# Patient Record
Sex: Female | Born: 1965 | Race: Asian | Hispanic: No | Marital: Married | Smoking: Never smoker
Health system: Southern US, Community
[De-identification: ages and names within clinical notes are randomized; demographics above are authoritative.]

## PROBLEM LIST (undated history)

## (undated) DIAGNOSIS — I10 Essential (primary) hypertension: Secondary | ICD-10-CM

## (undated) DIAGNOSIS — R87810 Cervical high risk human papillomavirus (HPV) DNA test positive: Principal | ICD-10-CM

## (undated) DIAGNOSIS — E669 Obesity, unspecified: Secondary | ICD-10-CM

## (undated) DIAGNOSIS — R87610 Atypical squamous cells of undetermined significance on cytologic smear of cervix (ASC-US): Secondary | ICD-10-CM

## (undated) DIAGNOSIS — E785 Hyperlipidemia, unspecified: Secondary | ICD-10-CM

## (undated) DIAGNOSIS — E079 Disorder of thyroid, unspecified: Secondary | ICD-10-CM

## (undated) DIAGNOSIS — Z8719 Personal history of other diseases of the digestive system: Secondary | ICD-10-CM

## (undated) HISTORY — DX: Hyperlipidemia, unspecified: E78.5

## (undated) HISTORY — DX: Obesity, unspecified: E66.9

## (undated) HISTORY — DX: Essential (primary) hypertension: I10

## (undated) HISTORY — DX: Atypical squamous cells of undetermined significance on cytologic smear of cervix (ASC-US): R87.610

## (undated) HISTORY — DX: Cervical high risk human papillomavirus (HPV) DNA test positive: R87.810

## (undated) HISTORY — DX: Personal history of other diseases of the digestive system: Z87.19

---

## 2005-10-08 ENCOUNTER — Ambulatory Visit: Payer: Self-pay | Admitting: Internal Medicine

## 2005-10-23 ENCOUNTER — Ambulatory Visit: Payer: Self-pay | Admitting: Internal Medicine

## 2005-11-28 ENCOUNTER — Ambulatory Visit: Payer: Self-pay | Admitting: Internal Medicine

## 2006-04-14 ENCOUNTER — Ambulatory Visit: Payer: Self-pay | Admitting: Internal Medicine

## 2006-04-14 LAB — CONVERTED CEMR LAB
BUN: 12 mg/dL (ref 6–23)
CO2: 29 meq/L (ref 19–32)
Cholesterol: 220 mg/dL (ref 0–200)
Creatinine, Ser: 0.5 mg/dL (ref 0.4–1.2)
GFR calc Af Amer: 174 mL/min
Glucose, Bld: 122 mg/dL — ABNORMAL HIGH (ref 70–99)
HDL: 38.4 mg/dL — ABNORMAL LOW (ref >39.0)
Potassium: 3.7 meq/L (ref 3.5–5.1)
Sodium: 138 meq/L (ref 135–145)
Triglycerides: 190 mg/dL — ABNORMAL HIGH (ref 0–149)

## 2006-04-16 ENCOUNTER — Ambulatory Visit: Payer: Self-pay | Admitting: Internal Medicine

## 2007-05-21 ENCOUNTER — Encounter: Payer: Self-pay | Admitting: Internal Medicine

## 2007-07-06 LAB — HM MAMMOGRAPHY: HM Mammogram: NORMAL

## 2007-07-10 DIAGNOSIS — I10 Essential (primary) hypertension: Secondary | ICD-10-CM

## 2007-07-10 DIAGNOSIS — E785 Hyperlipidemia, unspecified: Secondary | ICD-10-CM | POA: Insufficient documentation

## 2007-07-13 ENCOUNTER — Telehealth: Payer: Self-pay | Admitting: Internal Medicine

## 2007-07-13 ENCOUNTER — Ambulatory Visit: Payer: Self-pay | Admitting: Internal Medicine

## 2007-07-13 DIAGNOSIS — Z8719 Personal history of other diseases of the digestive system: Secondary | ICD-10-CM

## 2007-07-13 DIAGNOSIS — R05 Cough: Secondary | ICD-10-CM | POA: Insufficient documentation

## 2007-07-13 DIAGNOSIS — R059 Cough, unspecified: Secondary | ICD-10-CM | POA: Insufficient documentation

## 2007-09-16 ENCOUNTER — Ambulatory Visit: Payer: Self-pay | Admitting: Internal Medicine

## 2007-09-16 LAB — CONVERTED CEMR LAB
Calcium: 9.1 mg/dL (ref 8.4–10.5)
Cholesterol: 208 mg/dL (ref 0–200)
Creatinine, Ser: 0.5 mg/dL (ref 0.4–1.2)
Direct LDL: 138.8 mg/dL
GFR calc Af Amer: 173 mL/min
HDL: 35.4 mg/dL — ABNORMAL LOW (ref >39.0)
Hgb A1c MFr Bld: 6.6 % — ABNORMAL HIGH (ref 4.6–6.0)
Sodium: 139 meq/L (ref 135–145)
Total CHOL/HDL Ratio: 5.9
Triglycerides: 186 mg/dL — ABNORMAL HIGH (ref 0–149)
VLDL: 37 mg/dL (ref 0–40)

## 2007-09-20 ENCOUNTER — Telehealth: Payer: Self-pay | Admitting: Internal Medicine

## 2007-09-30 ENCOUNTER — Ambulatory Visit: Payer: Self-pay | Admitting: Internal Medicine

## 2007-09-30 ENCOUNTER — Telehealth: Payer: Self-pay | Admitting: Internal Medicine

## 2007-09-30 DIAGNOSIS — R5381 Other malaise: Secondary | ICD-10-CM

## 2007-09-30 DIAGNOSIS — R5383 Other fatigue: Secondary | ICD-10-CM

## 2007-09-30 DIAGNOSIS — IMO0001 Reserved for inherently not codable concepts without codable children: Secondary | ICD-10-CM

## 2007-09-30 DIAGNOSIS — D179 Benign lipomatous neoplasm, unspecified: Secondary | ICD-10-CM | POA: Insufficient documentation

## 2007-09-30 LAB — CONVERTED CEMR LAB: Total CK: 61 units/L (ref 7–177)

## 2007-11-03 ENCOUNTER — Encounter: Payer: Self-pay | Admitting: Internal Medicine

## 2007-12-31 ENCOUNTER — Ambulatory Visit: Payer: Self-pay | Admitting: Internal Medicine

## 2007-12-31 DIAGNOSIS — R1013 Epigastric pain: Secondary | ICD-10-CM

## 2007-12-31 DIAGNOSIS — K3189 Other diseases of stomach and duodenum: Secondary | ICD-10-CM

## 2007-12-31 LAB — CONVERTED CEMR LAB
Helicobacter Pylori Antibody-IgG: 0.4
Thyroglobulin Ab: 100.3 — ABNORMAL HIGH (ref 0.0–60.0)
Thyroperoxidase Ab SerPl-aCnc: 26.4 (ref 0.0–60.0)

## 2008-01-04 ENCOUNTER — Encounter: Payer: Self-pay | Admitting: Internal Medicine

## 2009-01-25 ENCOUNTER — Ambulatory Visit: Payer: Self-pay | Admitting: Internal Medicine

## 2009-01-25 LAB — CONVERTED CEMR LAB
ALT: 59 units/L — ABNORMAL HIGH (ref 0–35)
Albumin: 4.7 g/dL (ref 3.5–5.2)
Alkaline Phosphatase: 53 units/L (ref 39–117)
BUN: 8 mg/dL (ref 6–23)
Chloride: 102 meq/L (ref 96–112)
Cholesterol: 255 mg/dL — ABNORMAL HIGH (ref 0–200)
Creatinine, Ser: 0.54 mg/dL (ref 0.40–1.20)
HDL: 38 mg/dL — ABNORMAL LOW (ref >39)
Indirect Bilirubin: 0.5 mg/dL (ref 0.0–0.9)
LDL Cholesterol: 164 mg/dL — ABNORMAL HIGH (ref 0–99)
Potassium: 4.2 meq/L (ref 3.5–5.3)
Total Protein: 7.8 g/dL (ref 6.0–8.3)
Triglycerides: 266 mg/dL — ABNORMAL HIGH (ref <150)
VLDL: 53 mg/dL — ABNORMAL HIGH (ref 0–40)

## 2009-01-26 ENCOUNTER — Encounter: Payer: Self-pay | Admitting: Internal Medicine

## 2009-08-18 ENCOUNTER — Telehealth: Payer: Self-pay | Admitting: Internal Medicine

## 2009-09-15 LAB — HM COLONOSCOPY: HM Colonoscopy: NORMAL

## 2010-04-17 NOTE — Progress Notes (Signed)
  Phone Note Other Incoming   Summary of Call: pt going on 2 month trip to [place]. [place].  She requests - 3 month supply  Initial call taken by: D. Thomos Lemons DO,  August 18, 2009 9:32 AM    Prescriptions: PRAVASTATIN SODIUM 40 MG TABS (PRAVASTATIN SODIUM) one by mouth qpm  #90 x 1   Entered and Authorized by:   D. Thomos Lemons DO   Signed by:   D. Thomos Lemons DO on 08/18/2009   Method used:   [facility]        [id number]       [address] [place], [place]  [address]       Ph: [phone] or [phone]       Fax: [phone]   RxID:   [id number] LISINOPRIL 10 MG TABS (LISINOPRIL) one by mouth once daily  #90 x 1   Entered and Authorized by:   D. Thomos Lemons DO   Signed by:   D. Thomos Lemons DO on 08/18/2009   Method used:   [facility]        [id number]       [address]       [place], [place]  [address]       Ph: [phone] or [phone]       Fax: [phone]   RxID:   [id number]

## 2010-07-15 ENCOUNTER — Encounter: Payer: Self-pay | Admitting: Internal Medicine

## 2010-07-17 ENCOUNTER — Encounter: Payer: Self-pay | Admitting: Internal Medicine

## 2010-07-17 ENCOUNTER — Ambulatory Visit (INDEPENDENT_AMBULATORY_CARE_PROVIDER_SITE_OTHER): Payer: 59 | Admitting: Internal Medicine

## 2010-07-17 DIAGNOSIS — E039 Hypothyroidism, unspecified: Secondary | ICD-10-CM

## 2010-07-17 DIAGNOSIS — R7309 Other abnormal glucose: Secondary | ICD-10-CM

## 2010-07-17 DIAGNOSIS — I10 Essential (primary) hypertension: Secondary | ICD-10-CM

## 2010-07-17 DIAGNOSIS — E041 Nontoxic single thyroid nodule: Secondary | ICD-10-CM | POA: Insufficient documentation

## 2010-07-17 DIAGNOSIS — E785 Hyperlipidemia, unspecified: Secondary | ICD-10-CM

## 2010-07-17 MED ORDER — LOSARTAN POTASSIUM 25 MG PO TABS: 25.0000 mg | ORAL_TABLET | Freq: Every day | ORAL | Status: DC

## 2010-07-17 MED ORDER — LEVOTHYROXINE SODIUM 50 MCG PO TABS: ORAL_TABLET | ORAL | Status: DC

## 2010-07-17 MED ORDER — ATORVASTATIN CALCIUM 20 MG PO TABS: 20.0000 mg | ORAL_TABLET | Freq: Every day | ORAL | Status: DC

## 2010-07-17 NOTE — Assessment & Plan Note (Signed)
Poorly controlled on pravastatin. Prev LDL 189 10/11/2009 Change to lipitor Dyslipidemia also likely aggravated by hypothyroidims

## 2010-07-17 NOTE — Progress Notes (Signed)
  Subjective:    Patient ID: Alexandria Ayers, female    DOB: [birth year], 45 y.o.   MRN: [id number]  Hypertension This is a chronic problem. The current episode started more than 1 year ago. The problem is unchanged. The problem is controlled. There are no associated agents to hypertension. Past treatments include ACE inhibitors. There are no compliance problems.  There is no history of kidney disease, heart failure or left ventricular hypertrophy.   She c/o chronic dry cough and scratchy throat.  Int hx:  Seen by physicians in [place] for routine health screening.  Labs and thyroid u/s performed. She reports thyroid US showed nodule.  F/u u/s recommended in 6 months  Hyperlipidemia - labs reviewed.  Does not follow low sat fat diet  Review of Systems     Past Medical History  Diagnosis Date  . Abnormal glucose   . Hyperlipidemia   . Hypertension   . History of hemorrhoids   . Lipoma     History   Social History  . Marital Status: Married    Spouse Name: N/A    Number of Children: N/A  . Years of Education: N/A   Occupational History  . Not on file.   Social History Main Topics  . Smoking status: Never Smoker   . Smokeless tobacco: Not on file  . Alcohol Use: Not on file  . Drug Use: Not on file  . Sexually Active: Not on file   Other Topics Concern  . Not on file   Social History Narrative   Occupation: Housewife   Married   Never Smoked   Alcohol use-no   Daughter - Junious Dresser (8y/o)    No past surgical history on file.  Family History  Problem Relation Age of Onset  . Hypertension Mother   . Kidney disease Mother     chronic renal insufficiency or some manner of kidney abnormality  . Coronary artery disease      sibling  . Hypothyroidism Mother     s/p thryroid surgery and is hypothyroid  . Other      denies FH of colon cancer,and diabetes    No Known Allergies  Current Outpatient Prescriptions on File Prior to Visit  Medication Sig Dispense Refill  .  DISCONTD: lisinopril (PRINIVIL,ZESTRIL) 10 MG tablet Take 10 mg by mouth daily.        Marland Kitchen DISCONTD: pravastatin (PRAVACHOL) 40 MG tablet Take 40 mg by mouth every evening.        Marland Kitchen glucose blood (FREESTYLE LITE) test strip Use to check blood sugar once a day         BP 90/60  Pulse 72  Temp(Src) 98.9 F (37.2 C) (Oral)  Resp 16  Ht 5\' 3"  (1.6 m)  Wt 170 lb (77.111 kg)  BMI 30.11 kg/m2  SpO2 99%  LMP 06/19/2010    Objective:   Physical Exam  Constitutional: She appears well-developed and well-nourished.  Neck: Normal range of motion. Neck supple. No thyromegaly present.  Cardiovascular: Regular rhythm and normal heart sounds.   Pulmonary/Chest: Effort normal and breath sounds normal. She has no wheezes. She has no rales.  Lymphadenopathy:    She has no cervical adenopathy.  Neurological: She is alert.  Skin: Skin is warm and dry.  Psychiatric: She has a normal mood and affect. Her behavior is normal.          Assessment & Plan:

## 2010-07-17 NOTE — Assessment & Plan Note (Signed)
Pt notes chronic dry cough.  It may be related to ACE.  I suggest change to losartan. We discussed risk of ACE or ARB during pregnancy.

## 2010-07-17 NOTE — Assessment & Plan Note (Signed)
Unchanged A1c 09/2009 - 6.2

## 2010-07-17 NOTE — Assessment & Plan Note (Signed)
Pt reports thyroid u/s completed while visiting family in [place] 2011 It noted thyroid nodule.  She does not recall size of nodule but she was advised to have f/u nodule in 6 months. Order for u/s provided.

## 2010-07-17 NOTE — Assessment & Plan Note (Signed)
TSH in 09/2009 15.8 Pt has had mild elevated TSH in the past.  Positive thyroglobulin antibodies.  Lab Results  Component Value Date   TSH 3.150 01/25/2009   Start thyroid replacement

## 2010-07-17 NOTE — Patient Instructions (Signed)
Please complete the following lab tests before your next follow up appointment: FLP, LFTs - 272.4 BMET, A1c - 790.29 TSH - 244.9

## 2010-07-31 ENCOUNTER — Ambulatory Visit (HOSPITAL_BASED_OUTPATIENT_CLINIC_OR_DEPARTMENT_OTHER)
Admission: RE | Admit: 2010-07-31 | Discharge: 2010-07-31 | Disposition: A | Discharge status: Home / Self Care | Payer: 59 | Source: Ambulatory Visit | Attending: Internal Medicine | Admitting: Internal Medicine

## 2010-07-31 DIAGNOSIS — E041 Nontoxic single thyroid nodule: Secondary | ICD-10-CM | POA: Insufficient documentation

## 2010-08-03 NOTE — Assessment & Plan Note (Signed)
[facility]                             PRIMARY CARE OFFICE NOTE   NAME:Alexandria Ayers, Alexandria Ayers                        MRN:          [id number]  DATE:10/08/2005                            DOB:          07/09/1965    CHIEF COMPLAINT:  New patient.   HISTORY OF PRESENT ILLNESS:  Patient is a 45 year old [place] female here to  establish primary care.  Patient was formerly followed by a physician in  [place], [place].  She has been fairly healthy for most of her  life.  She has been struggling with her weight somewhat and has had elevated  blood pressure readings in the past.  Last she discussed with her previous  physician, he recommended lifestyle changes, exercise and decreased sodium  intake.  Patient has not tracked her blood pressure at home but has not  followed up with her previous physician either.   She denies any history of heart disease or type 2 diabetes.  She also has  been noted to have elevated cholesterol readings in the past.  Her only  hospitalizations have been for two natural childbirths.  She has two  children, ages 63 and a 18-year-old who is accompanying her today.   She does occasionally have nonexertional chest discomfort and she points to  lower sternum/epigastric area.  Patient states that it feels like a pulled  muscle.  It does sometimes worsen with twisting of her spine.  She states  pain lasts approximately 20 minutes and resolves on its own.  Denies any  exertional symptoms or any shortness of breath.   When considering antihypertensives in the past, she has been reluctant to  start any medications that may affect potential pregnancy, although she is  3 years old, her husband and the patient have not decided to stop having  children.  She is not actively using any birth control at this time.   CURRENT MEDICATIONS:  None.   ALLERGIES:  NO KNOWN DRUG ALLERGIES.   SOCIAL HISTORY:  Patient is married, is currently a  housewife.   FAMILY HISTORY:  Mother age 69, has hypertension and chronic renal  insufficiency or some manner of mild kidney abnormality.  Father is age 73,  is fairly healthy.  Denies any family history of diabetes.  Patient does  have a sibling who is known to have heart disease.   PREVENTATIVE HEALTH CARE HISTORY:  Her last Pap was in 2005.  Her last  mammogram was also in 2005.  She had a colonoscopy done in 2004, performed  in [place].   HABITS:  She does not drink or smoke.  She has never smoked in the past.   REVIEW OF SYSTEMS:  No fever or chills.  No HEENT symptoms.  CARDIOVASCULAR:  As noted above.  Patient denies any heartburn on a regular basis.  Does have  some discomfort with spicy foods.  No dark stools or loose stools or  constipation.  No dysuria, frequency or urgency.  All other systems  negative.   PHYSICAL EXAMINATION:  GENERAL APPEARANCE:  The patient  is a very pleasant  overweight/obese [place] female in no apparent distress.  VITAL SIGNS:  Height is 5 feet 4 inches, weight 182 pounds, temperature  97.8, pulse 73 and blood pressure is 146/97, automated cuff, and I obtained  160/100 in the left arm in the seated position.  HEENT:  Normocephalic and atraumatic.  Pupils are equal and reactive to  light bilaterally.  Extraocular motility was intact.  Patient was anicteric.  Conjunctivae within normal limits.  External auditory canals and tympanic  membranes were clear bilaterally.  Oropharynx was unremarkable.  NECK:  Supple with no adenopathy, carotid bruit, thyromegaly or thyroid  nodules.  CHEST:  Normal expiratory effort.  Chest is clear to auscultation  bilaterally.  No rhonchi, rales or wheezing.  CARDIOVASCULAR:  Regular rate and rhythm.  No significant murmurs, rubs, or  gallops appreciated.  I could not elicit any tenderness with palpation of  her upper sternum.  ABDOMEN:  Slightly protuberant, nontender, no organomegaly.  MUSCULOSKELETAL:  No  clubbing, cyanosis, or edema.  Patient had intact  dorsalis pedis pulses bilaterally.  NEUROLOGIC:  Cranial nerves II-XII grossly intact.  She was nonfocal.   IMPRESSION/RECOMMENDATIONS:  1.  Atypical chest pain.  2.  Hypertension uncontrolled.  3.  History of elevated cholesterol readings.  4.  Health maintenance.   RECOMMENDATIONS:  1.  Patient is to discuss birth control with her husband.  It does limit      agents that we can potentially use to control her blood pressure.      Patient will likely need multiple agents to adequately control her blood      pressure.  For now, she will be started on hydrochlorothiazide 12.5 mg      one a day.  2.  In terms of her atypical chest pain, EKG was performed in the office      which showed normal sinus rhythm at 72 beats per minute, no ST changes      were noted.  She is to try some Protonix which I provided samples for      today.  Her symptoms may be gastrointestinal.  However, if they do not      resolve in one week's time, we discussed the need for stress testing.      She will be sent for her lipid panel, TSH and fasting blood sugar and      will follow up with me in approximately one to two weeks' time.  At that      time, we will further discuss health maintenance needs such as need for      a mammogram and follow-up Pap and pelvic.                                   Barbette Hair. Artist Pais, DO   RDY/MedQ  DD:  10/09/2005  DT:  10/09/2005  Job #:  [id number]

## 2010-08-09 ENCOUNTER — Encounter: Payer: Self-pay | Admitting: Internal Medicine

## 2010-09-18 ENCOUNTER — Ambulatory Visit (INDEPENDENT_AMBULATORY_CARE_PROVIDER_SITE_OTHER): Payer: 59 | Admitting: Family

## 2010-09-18 ENCOUNTER — Ambulatory Visit: Payer: 59 | Admitting: Internal Medicine

## 2010-09-18 ENCOUNTER — Encounter: Payer: Self-pay | Admitting: Family

## 2010-09-18 DIAGNOSIS — E039 Hypothyroidism, unspecified: Secondary | ICD-10-CM

## 2010-09-18 DIAGNOSIS — I10 Essential (primary) hypertension: Secondary | ICD-10-CM

## 2010-09-18 DIAGNOSIS — E785 Hyperlipidemia, unspecified: Secondary | ICD-10-CM

## 2010-09-18 LAB — BASIC METABOLIC PANEL
BUN: 8 mg/dL (ref 6–23)
CO2: 26 mEq/L (ref 19–32)
Chloride: 102 mEq/L (ref 96–112)
Creat: 0.5 mg/dL (ref 0.50–1.10)

## 2010-09-18 LAB — LIPID PANEL
Cholesterol: 207 mg/dL — ABNORMAL HIGH (ref 0–200)
Triglycerides: 419 mg/dL — ABNORMAL HIGH (ref <150)

## 2010-09-18 LAB — HEPATIC FUNCTION PANEL
ALT: 46 U/L — ABNORMAL HIGH (ref 0–35)
Total Protein: 7.5 g/dL (ref 6.0–8.3)

## 2010-09-18 MED ORDER — ATORVASTATIN CALCIUM 20 MG PO TABS: 20.0000 mg | ORAL_TABLET | Freq: Every day | ORAL | Status: DC

## 2010-09-18 NOTE — Progress Notes (Signed)
Subjective:    Patient ID: Alexandria Ayers, female    DOB: [birth year], 45 y.o.   MRN: [id number]  HPI  Ms.  Alexandria Ayers is a 45 yr old female who presents today for follow up. She presents today with a [place] interpreter.    Hyperlipidemia-  Notes that lipitor is too expensive.  Wants to try generic.    Patient presents today for followup of hypertension.  Patient has been treated for Chronic HTN for quiet sometime. She is currently on cozaar, and well controlled. No associated S/S related to HTN.   Quality: chronic Modifying factor: meds Duration: Quite sometime Associated S/S: None.  The patient denies the following associated symptoms: Chest pain, dyspnea, blurred vision, headache, or lower extremity edema.   Review of Systems See HPI  Past Medical History  Diagnosis Date  . Abnormal glucose   . Hyperlipidemia   . Hypertension   . History of hemorrhoids   . Lipoma     History   Social History  . Marital Status: Married    Spouse Name: N/A    Number of Children: N/A  . Years of Education: N/A   Occupational History  . Not on file.   Social History Main Topics  . Smoking status: Never Smoker   . Smokeless tobacco: Not on file  . Alcohol Use: Not on file  . Drug Use: Not on file  . Sexually Active: Not on file   Other Topics Concern  . Not on file   Social History Narrative   Occupation: Housewife   Married   Never Smoked   Alcohol use-no   Daughter - Alexandria Ayers (8y/o)    No past surgical history on file.  Family History  Problem Relation Age of Onset  . Hypertension Mother   . Kidney disease Mother     chronic renal insufficiency or some manner of kidney abnormality  . Coronary artery disease      sibling  . Hypothyroidism Mother     s/p thryroid surgery and is hypothyroid  . Other      denies FH of colon cancer,and diabetes    No Known Allergies  Current Outpatient Prescriptions on File Prior to Visit  Medication Sig Dispense Refill  . atorvastatin  (LIPITOR) 20 MG tablet Take 1 tablet (20 mg total) by mouth daily.  30 tablet  3  . glucose blood (FREESTYLE LITE) test strip Use to check blood sugar once a day       . levothyroxine (SYNTHROID, LEVOTHROID) 50 MCG tablet 1/2 tab once daily x 2 weeks, then 1 tab once daily  30 tablet  3  . losartan (COZAAR) 25 MG tablet Take 1 tablet (25 mg total) by mouth daily.  30 tablet  3    BP 110/80  Pulse 77  Temp(Src) 98 F (36.7 C) (Oral)  Resp 16  Ht 5\' 3"  (1.6 m)  Wt 170 lb (77.111 kg)  BMI 30.11 kg/m2  SpO2 96%        Objective:   Physical Exam  Constitutional: She appears well-developed and well-nourished.  HENT:  Head: Normocephalic and atraumatic.  Eyes: Conjunctivae are normal. Pupils are equal, round, and reactive to light.  Neck: Normal range of motion. Neck supple.  Cardiovascular: Normal rate and regular rhythm.   Pulmonary/Chest: Effort normal and breath sounds normal.  Musculoskeletal: She exhibits no edema.  Psychiatric: She has a normal mood and affect. Her behavior is normal. Judgment normal.  Assessment & Plan:

## 2010-09-18 NOTE — Patient Instructions (Signed)
Please complete your lab work on the first floor. Follow up in 6 months.

## 2010-09-22 ENCOUNTER — Telehealth: Payer: Self-pay | Admitting: Family

## 2010-09-24 NOTE — Assessment & Plan Note (Signed)
Will switch to generic atorvastatin.

## 2010-09-24 NOTE — Assessment & Plan Note (Signed)
BP Readings from Last 3 Encounters:  09/18/10 110/80  07/17/10 90/60  01/25/09 132/80  BP stable, continue current meds.

## 2010-09-27 ENCOUNTER — Telehealth: Payer: Self-pay | Admitting: Family

## 2010-09-27 DIAGNOSIS — R739 Hyperglycemia, unspecified: Secondary | ICD-10-CM

## 2010-09-27 NOTE — Telephone Encounter (Signed)
Please call patient and let her know that her sugar and one of her liver tests are slightly elevated.  I would like for her to complete some additional blood work and an ultrasound of her liver. Labs are pended below.

## 2010-09-27 NOTE — Telephone Encounter (Signed)
Left message on machine to return my call. 

## 2010-10-03 NOTE — Telephone Encounter (Signed)
Left message for pt to return my call.

## 2010-10-08 NOTE — Telephone Encounter (Signed)
Opened in Error.

## 2010-10-08 NOTE — Telephone Encounter (Signed)
Left message on machine for pt to return my call. Mailed contact letter.

## 2010-10-10 ENCOUNTER — Telehealth: Payer: Self-pay | Admitting: Family

## 2010-10-10 ENCOUNTER — Ambulatory Visit (HOSPITAL_BASED_OUTPATIENT_CLINIC_OR_DEPARTMENT_OTHER)
Admission: RE | Admit: 2010-10-10 | Discharge: 2010-10-10 | Disposition: A | Discharge status: Home / Self Care | Payer: 59 | Source: Ambulatory Visit | Attending: Family | Admitting: Family

## 2010-10-10 DIAGNOSIS — K7689 Other specified diseases of liver: Secondary | ICD-10-CM

## 2010-10-10 DIAGNOSIS — R945 Abnormal results of liver function studies: Secondary | ICD-10-CM | POA: Insufficient documentation

## 2010-10-10 NOTE — Telephone Encounter (Signed)
Per Sandford Craze, NP, she has notified pt's daughter of need to return for additional testing below. Lab orders signed and forwarded to the lab.

## 2010-10-10 NOTE — Telephone Encounter (Signed)
Reviewed ultrasound- symptoms consistent with fatty liver.  Reviewed with patient and her daughter the importance of low cholesterol/low fat diet and exercise.  Also requested that she return to the lab this week to complete blood work.

## 2010-10-29 ENCOUNTER — Encounter: Payer: Self-pay | Admitting: Family

## 2010-11-21 ENCOUNTER — Other Ambulatory Visit: Payer: Self-pay | Admitting: Internal Medicine

## 2010-12-12 ENCOUNTER — Telehealth: Payer: Self-pay | Admitting: Internal Medicine

## 2010-12-12 MED ORDER — LEVOTHYROXINE SODIUM 50 MCG PO TABS: ORAL_TABLET | ORAL | Status: DC

## 2010-12-12 NOTE — Telephone Encounter (Signed)
rx sent in electronically 

## 2010-12-12 NOTE — Telephone Encounter (Signed)
Refill- levothyroxine tablet. Take 1/2 tablet by mouth for 2 weeks then take 1 daily. Qty 30. Last fill 9.19.12

## 2010-12-13 ENCOUNTER — Other Ambulatory Visit: Payer: Self-pay | Admitting: *Deleted

## 2010-12-13 MED ORDER — LEVOTHYROXINE SODIUM 50 MCG PO TABS: ORAL_TABLET | ORAL | Status: DC

## 2011-01-18 ENCOUNTER — Telehealth: Payer: Self-pay | Admitting: *Deleted

## 2011-01-18 NOTE — Telephone Encounter (Signed)
[facility]'s [facility] was requiring prior authorization. Per Myriam Jacobson, she spoke with Lacey at [facility] [phone] and verified that prior Berkley Harvey is not required for this medication. Left message on pharmacy voicemail that P.A. Is not required and to call if any questions.

## 2011-03-20 ENCOUNTER — Other Ambulatory Visit: Payer: Self-pay | Admitting: Internal Medicine

## 2011-03-20 ENCOUNTER — Telehealth: Payer: Self-pay

## 2011-03-20 MED ORDER — LEVOTHYROXINE SODIUM 50 MCG PO TABS: ORAL_TABLET | ORAL | Status: DC

## 2011-03-20 NOTE — Telephone Encounter (Signed)
30 day supply sent to pharmacy. She will need OV prior to additional refills.

## 2011-03-20 NOTE — Telephone Encounter (Signed)
Pharmacy called requesting rx for synthroid 50 mg.

## 2011-03-20 NOTE — Telephone Encounter (Signed)
Please call pt to arrange follow up if not already scheduled.

## 2011-03-21 NOTE — Telephone Encounter (Signed)
Patient made appt for 03/27/11

## 2011-03-27 ENCOUNTER — Encounter: Payer: Self-pay | Admitting: Family

## 2011-03-27 ENCOUNTER — Ambulatory Visit (INDEPENDENT_AMBULATORY_CARE_PROVIDER_SITE_OTHER): Payer: 59 | Admitting: Family

## 2011-03-27 DIAGNOSIS — N898 Other specified noninflammatory disorders of vagina: Secondary | ICD-10-CM

## 2011-03-27 DIAGNOSIS — J40 Bronchitis, not specified as acute or chronic: Secondary | ICD-10-CM

## 2011-03-27 DIAGNOSIS — E785 Hyperlipidemia, unspecified: Secondary | ICD-10-CM

## 2011-03-27 DIAGNOSIS — I10 Essential (primary) hypertension: Secondary | ICD-10-CM

## 2011-03-27 DIAGNOSIS — E039 Hypothyroidism, unspecified: Secondary | ICD-10-CM

## 2011-03-27 DIAGNOSIS — N760 Acute vaginitis: Secondary | ICD-10-CM | POA: Insufficient documentation

## 2011-03-27 MED ORDER — AZITHROMYCIN 250 MG PO TABS: ORAL_TABLET | ORAL | Status: AC

## 2011-03-27 MED ORDER — AMLODIPINE BESYLATE 5 MG PO TABS: 5.0000 mg | ORAL_TABLET | Freq: Every day | ORAL | Status: DC

## 2011-03-27 MED ORDER — SIMVASTATIN 40 MG PO TABS: 40.0000 mg | ORAL_TABLET | Freq: Every day | ORAL | Status: DC

## 2011-03-27 MED ORDER — COLESEVELAM HCL 625 MG PO TABS: 1875.0000 mg | ORAL_TABLET | Freq: Two times a day (BID) | ORAL | Status: DC

## 2011-03-27 NOTE — Assessment & Plan Note (Signed)
I am concerned that she is not yet menopausal and she and her husband do not use birth control.  Statins would be very unsafe for her if she became pregnant.  Will plan to switch to welchol instead and have her focus on her diet.

## 2011-03-27 NOTE — Progress Notes (Signed)
  Subjective:    Patient ID: Alexandria Ayers, female    DOB: [birth year], 46 y.o.   MRN: [id number]  HPI  Alexandria Ayers is a 46 yr old female who presents today for follow up. She also wishes to address cough.  1) Cough- Cough with phlegm and nasal congestion for 2 weeks.  She notes some wheezing when she lays flat.  She has used tylenol and otc cold medication with some improvement. She denies associated fever.   2) HTN-  She reports that she has been using cozaar without any difficulty. She denies chest pain or edma.  3) Hyperlipidemia-  Has not been compliant with diet.      Review of Systems See HPI  Past Medical History  Diagnosis Date  . Abnormal glucose   . Hyperlipidemia   . Hypertension   . History of hemorrhoids   . Lipoma     History   Social History  . Marital Status: Married    Spouse Name: N/A    Number of Children: N/A  . Years of Education: N/A   Occupational History  . Not on file.   Social History Main Topics  . Smoking status: Never Smoker   . Smokeless tobacco: Not on file  . Alcohol Use: Not on file  . Drug Use: Not on file  . Sexually Active: Not on file   Other Topics Concern  . Not on file   Social History Narrative   Occupation: Housewife   Married   Never Smoked   Alcohol use-no   Daughter - Junious Dresser (8y/o)    No past surgical history on file.  Family History  Problem Relation Age of Onset  . Hypertension Mother   . Kidney disease Mother     chronic renal insufficiency or some manner of kidney abnormality  . Coronary artery disease      sibling  . Hypothyroidism Mother     s/p thryroid surgery and is hypothyroid  . Other      denies FH of colon cancer,and diabetes    No Known Allergies  Current Outpatient Prescriptions on File Prior to Visit  Medication Sig Dispense Refill  . glucose blood (FREESTYLE LITE) test strip Use to check blood sugar once a day       . losartan (COZAAR) 25 MG tablet TAKE 1 TABLET (25 MG TOTAL) BY MOUTH  DAILY.  30 tablet  3    BP 122/80  Pulse 78  Temp(Src) 97.8 F (36.6 C) (Oral)  Resp 16  Ht 5\' 3"  (1.6 m)  Wt 170 lb 1.3 oz (77.148 kg)  BMI 30.13 kg/m2  LMP 02/25/2011       Objective:   Physical Exam  Constitutional: She appears well-developed and well-nourished. No distress.  Cardiovascular: Normal rate and regular rhythm.   No murmur heard. Pulmonary/Chest: Effort normal and breath sounds normal.  Abdominal: Soft. Bowel sounds are normal. She exhibits no distension. There is no tenderness. There is no rebound and no guarding.  Genitourinary:       Vulva are noted to be pink/irritated, no noticeable discharge on exam. No lesions.   Psychiatric: She has a normal mood and affect. Her behavior is normal. Thought content normal.          Assessment & Plan:

## 2011-03-27 NOTE — Assessment & Plan Note (Signed)
Wet prep performed today, suspect yeast. Await results and plan to treat accordingly.

## 2011-03-27 NOTE — Assessment & Plan Note (Signed)
Will treat with zithromax 

## 2011-03-27 NOTE — Assessment & Plan Note (Signed)
Obtain TSH, clinically stable.

## 2011-03-27 NOTE — Patient Instructions (Signed)
Please complete your blood work prior to leaving today.  Follow up in 1 month, come fasting to this appointment.  Work hard on a low cholesterol diet and exercise.

## 2011-03-27 NOTE — Assessment & Plan Note (Signed)
Switch from ARB to CCB as this is a safer medication in the case of pregnancy.  Pt will follow up in 1 month.

## 2011-03-28 ENCOUNTER — Telehealth: Payer: Self-pay | Admitting: Family

## 2011-03-28 LAB — WET PREP BY MOLECULAR PROBE: Gardnerella vaginalis: NEGATIVE

## 2011-03-28 MED ORDER — FLUCONAZOLE 150 MG PO TABS: ORAL_TABLET | ORAL | Status: DC

## 2011-03-28 NOTE — Telephone Encounter (Addendum)
Pls call pt and let her know that wet prep shows yeast infection.  I have sent diflucan to her pharmacy.Also, did she complete lab work?

## 2011-03-29 NOTE — Telephone Encounter (Signed)
Notified pt. States she thought she was supposed to have labs done at next visit. Advised pt to return to the office one morning next week for fasting labs. Pt voices understanding.

## 2011-04-04 LAB — TSH: TSH: 4.032 u[IU]/mL (ref 0.350–4.500)

## 2011-04-04 LAB — BASIC METABOLIC PANEL
CO2: 21 mEq/L (ref 19–32)
Calcium: 9.5 mg/dL (ref 8.4–10.5)
Chloride: 99 mEq/L (ref 96–112)
Sodium: 136 mEq/L (ref 135–145)

## 2011-04-05 ENCOUNTER — Telehealth: Payer: Self-pay | Admitting: *Deleted

## 2011-04-05 NOTE — Telephone Encounter (Signed)
The lab is unable to add hgb a1c as there was no lavender tube submitted.

## 2011-04-05 NOTE — Telephone Encounter (Signed)
Message copied by Kathi Simpers on Fri Apr 05, 2011 10:50 AM ------      Message from: O'SULLIVAN, MELISSA      Created: Fri Apr 05, 2011 10:25 AM       Could you pls ask lab to add on A1C- hyperglycemia? thanks

## 2011-04-05 NOTE — Telephone Encounter (Signed)
OK, will draw at his upcoming apt on 2/6.

## 2011-04-09 ENCOUNTER — Telehealth: Payer: Self-pay | Admitting: Family

## 2011-04-09 DIAGNOSIS — E039 Hypothyroidism, unspecified: Secondary | ICD-10-CM

## 2011-04-09 MED ORDER — LEVOTHYROXINE SODIUM 75 MCG PO TABS: 75.0000 ug | ORAL_TABLET | Freq: Every day | ORAL | Status: DC

## 2011-04-09 NOTE — Telephone Encounter (Signed)
Please call pt and let her know that I would like for her to increase synthroid to 75 mcg please.  Needs TSH in 6 weeks.

## 2011-04-10 ENCOUNTER — Other Ambulatory Visit: Payer: Self-pay | Admitting: Family

## 2011-04-10 NOTE — Telephone Encounter (Signed)
Left message on voicemail to return my call.  

## 2011-04-10 NOTE — Telephone Encounter (Signed)
Losartan was d/c'd last visit and pt was started on amlodipine. Refill request denied.

## 2011-04-11 ENCOUNTER — Other Ambulatory Visit: Payer: Self-pay | Admitting: Family

## 2011-04-11 NOTE — Telephone Encounter (Signed)
Notified pt. Future lab order placed for the week of 05/23/11 and copy forwarded to the lab.

## 2011-04-12 ENCOUNTER — Telehealth: Payer: Self-pay | Admitting: Family

## 2011-04-12 ENCOUNTER — Telehealth: Payer: Self-pay | Admitting: *Deleted

## 2011-04-12 NOTE — Telephone Encounter (Signed)
OK will plan to draw at her upcoming apt on 2/6.  Could you pls check that a translator has been arranged for this visit? thanks

## 2011-04-12 NOTE — Telephone Encounter (Signed)
[facility] losartin potassium  25 mg tab qty 30

## 2011-04-12 NOTE — Telephone Encounter (Signed)
See previous phone note.  

## 2011-04-12 NOTE — Telephone Encounter (Signed)
Message copied by Kathi Simpers on Fri Apr 12, 2011 10:59 AM ------      Message from: O'SULLIVAN, MELISSA      Created: Thu Apr 11, 2011  8:47 PM       Please ask lab to add on A1C- diagnosis is hyperglycemia.

## 2011-04-12 NOTE — Telephone Encounter (Signed)
Patient does not need the med darlene explained she was to be no longer taking it

## 2011-04-12 NOTE — Telephone Encounter (Signed)
Per Marj, interpreter has been arranged.

## 2011-04-12 NOTE — Telephone Encounter (Signed)
Unable to add hgb a1c as no lavender tube was drawn at last visit.

## 2011-04-12 NOTE — Telephone Encounter (Signed)
Notified [facility] to cancel [facility] request as pt has been taken off of medication. Request has been denied.

## 2011-04-24 ENCOUNTER — Encounter: Payer: Self-pay | Admitting: Family

## 2011-04-24 ENCOUNTER — Ambulatory Visit (INDEPENDENT_AMBULATORY_CARE_PROVIDER_SITE_OTHER): Payer: 59 | Admitting: Family

## 2011-04-24 VITALS — BP 118/90 | HR 71 | Temp 97.9°F | Resp 16 | Ht 63.0 in | Wt 171.0 lb

## 2011-04-24 DIAGNOSIS — R739 Hyperglycemia, unspecified: Secondary | ICD-10-CM

## 2011-04-24 DIAGNOSIS — E039 Hypothyroidism, unspecified: Secondary | ICD-10-CM

## 2011-04-24 DIAGNOSIS — E785 Hyperlipidemia, unspecified: Secondary | ICD-10-CM

## 2011-04-24 DIAGNOSIS — N926 Irregular menstruation, unspecified: Secondary | ICD-10-CM

## 2011-04-24 DIAGNOSIS — N760 Acute vaginitis: Secondary | ICD-10-CM

## 2011-04-24 DIAGNOSIS — R7309 Other abnormal glucose: Secondary | ICD-10-CM

## 2011-04-24 NOTE — Progress Notes (Signed)
Subjective:    Patient ID: Alexandria Ayers, female    DOB: 07/17/1965, 46 y.o.   MRN: [id number]  HPI  Alexandria Ayers is a 46 yr old [place] female who presents today for follow up . She is accompanied by a [place] Interpreter today.  Menstrual irregularities-  She reports that periods have been every 15 days this last month.   She denies hot flashes or heavy bleeding.   Hypothyroid- last visit her synthriod was increased- reports feeling "much better."  Yeast infection- reports that this is resolved.    Hyperglycemia- new finding on lab results.     Review of Systems See HPI  Past Medical History  Diagnosis Date  . Abnormal glucose   . Hyperlipidemia   . Hypertension   . History of hemorrhoids   . Lipoma     History   Social History  . Marital Status: Married    Spouse Name: N/A    Number of Children: N/A  . Years of Education: N/A   Occupational History  . Not on file.   Social History Main Topics  . Smoking status: Never Smoker   . Smokeless tobacco: Not on file  . Alcohol Use: Not on file  . Drug Use: Not on file  . Sexually Active: Not on file   Other Topics Concern  . Not on file   Social History Narrative   Occupation: Housewife   Married   Never Smoked   Alcohol use-no   Daughter - Junious Dresser (8y/o)    No past surgical history on file.  Family History  Problem Relation Age of Onset  . Hypertension Mother   . Kidney disease Mother     chronic renal insufficiency or some manner of kidney abnormality  . Coronary artery disease      sibling  . Hypothyroidism Mother     s/p thryroid surgery and is hypothyroid  . Other      denies FH of colon cancer,and diabetes    No Known Allergies  Current Outpatient Prescriptions on File Prior to Visit  Medication Sig Dispense Refill  . amLODipine (NORVASC) 5 MG tablet Take 1 tablet (5 mg total) by mouth daily.  30 tablet  2  . colesevelam (WELCHOL) 625 MG tablet Take 3 tablets (1,875 mg total) by mouth 2 (two) times  daily.  180 tablet  5  . fluconazole (DIFLUCAN) 150 MG tablet Take one tab today.  You may repeat in 3 days if symptoms are not resolved.  2 tablet  0  . glucose blood (FREESTYLE LITE) test strip Use to check blood sugar once a day       . levothyroxine (SYNTHROID) 75 MCG tablet Take 1 tablet (75 mcg total) by mouth daily.  30 tablet  1    BP 118/90  Pulse 71  Temp(Src) 97.9 F (36.6 C) (Oral)  Resp 16  Ht 5\' 3"  (1.6 m)  Wt 171 lb (77.565 kg)  BMI 30.29 kg/m2  SpO2 99%  LMP 04/24/2011       Objective:   Physical Exam  Constitutional: She appears well-developed and well-nourished. No distress.  Cardiovascular: Normal rate and regular rhythm.   No murmur heard. Pulmonary/Chest: Effort normal and breath sounds normal. No respiratory distress. She has no wheezes. She has no rales. She exhibits no tenderness.  Musculoskeletal: She exhibits no edema.  Skin: Skin is warm and dry.  Psychiatric: She has a normal mood and affect. Her behavior is normal. Judgment and thought content  normal.          Assessment & Plan:

## 2011-04-24 NOTE — Assessment & Plan Note (Signed)
Synthroid was recently adjusted. She will return for CPX/pap in march and we will plan to repeat at that time.

## 2011-04-24 NOTE — Patient Instructions (Signed)
Please schedule pap smear in March.  We will do your fasting blood work at that time.

## 2011-04-24 NOTE — Assessment & Plan Note (Signed)
Now on welchol.  Plan to check FLP next visit.

## 2011-04-24 NOTE — Assessment & Plan Note (Signed)
Suspect that she is perimenopausal.  Reassured you that irregular menses are common for her age group.

## 2011-04-24 NOTE — Assessment & Plan Note (Signed)
Obtain A1C. 

## 2011-04-24 NOTE — Assessment & Plan Note (Signed)
Resolved

## 2011-05-20 ENCOUNTER — Other Ambulatory Visit: Payer: Self-pay | Admitting: Family

## 2011-05-20 ENCOUNTER — Ambulatory Visit (INDEPENDENT_AMBULATORY_CARE_PROVIDER_SITE_OTHER): Payer: 59 | Admitting: Family

## 2011-05-20 ENCOUNTER — Encounter: Payer: Self-pay | Admitting: Family

## 2011-05-20 ENCOUNTER — Ambulatory Visit (HOSPITAL_BASED_OUTPATIENT_CLINIC_OR_DEPARTMENT_OTHER)
Admission: RE | Admit: 2011-05-20 | Discharge: 2011-05-20 | Disposition: A | Discharge status: Home / Self Care | Payer: 59 | Source: Ambulatory Visit | Attending: Family | Admitting: Family

## 2011-05-20 ENCOUNTER — Ambulatory Visit (HOSPITAL_BASED_OUTPATIENT_CLINIC_OR_DEPARTMENT_OTHER): Admission: RE | Admit: 2011-05-20 | Payer: 59 | Source: Ambulatory Visit

## 2011-05-20 DIAGNOSIS — R7309 Other abnormal glucose: Secondary | ICD-10-CM

## 2011-05-20 DIAGNOSIS — Z0000 Encounter for general adult medical examination without abnormal findings: Secondary | ICD-10-CM

## 2011-05-20 DIAGNOSIS — Z1231 Encounter for screening mammogram for malignant neoplasm of breast: Secondary | ICD-10-CM | POA: Insufficient documentation

## 2011-05-20 DIAGNOSIS — E785 Hyperlipidemia, unspecified: Secondary | ICD-10-CM

## 2011-05-20 DIAGNOSIS — E039 Hypothyroidism, unspecified: Secondary | ICD-10-CM

## 2011-05-20 DIAGNOSIS — I10 Essential (primary) hypertension: Secondary | ICD-10-CM

## 2011-05-20 DIAGNOSIS — Z23 Encounter for immunization: Secondary | ICD-10-CM

## 2011-05-20 DIAGNOSIS — R739 Hyperglycemia, unspecified: Secondary | ICD-10-CM

## 2011-05-20 LAB — LIPID PANEL: HDL: 33 mg/dL — ABNORMAL LOW (ref >39)

## 2011-05-20 LAB — CBC WITH DIFFERENTIAL/PLATELET
HCT: 36.8 % (ref 36.0–46.0)
Hemoglobin: 13.4 g/dL (ref 12.0–15.0)
Lymphocytes Relative: 40 % (ref 12–46)
MCHC: 36.4 g/dL — ABNORMAL HIGH (ref 30.0–36.0)
Monocytes Absolute: 0.4 10*3/uL (ref 0.1–1.0)
Monocytes Relative: 5 % (ref 3–12)
Neutro Abs: 3.6 10*3/uL (ref 1.7–7.7)
WBC: 7.4 10*3/uL (ref 4.0–10.5)

## 2011-05-20 LAB — HEPATIC FUNCTION PANEL
Albumin: 4.9 g/dL (ref 3.5–5.2)
Alkaline Phosphatase: 66 U/L (ref 39–117)
Total Bilirubin: 0.5 mg/dL (ref 0.3–1.2)

## 2011-05-20 NOTE — Patient Instructions (Signed)
Please complete your blood work prior to leaving.  Follow up in 3 months. 

## 2011-05-20 NOTE — Assessment & Plan Note (Signed)
BP Readings from Last 3 Encounters:  05/20/11 136/84  04/24/11 118/90  03/27/11 122/80  Plan to resume amlodipine. SBP was a little better last visit.

## 2011-05-20 NOTE — Progress Notes (Signed)
Addended by: Mervin Kung A on: 05/20/2011 02:15 PM   Modules accepted: Orders

## 2011-05-20 NOTE — Progress Notes (Signed)
Subjective:    Patient ID: Alexandria Ayers, female    DOB: 10-17-1965, 45 y.o.   MRN: [id number]  HPI  Ms.  Haddox is a 46 yr old female who presents today fasting for her complete physical.  She is accompanied today by a [place] interpreter. She reports that she is not exercising regularly.  Diet is fair-  Rice, wheat, trying to reduce red meat.  She is requesting referral to GYN for pap.  Due for mammogram.  Never had a tetanus shot.    Hypothryoid- feels good on current dose of synthroid.  HTN- has not taken amlodipine for the last 2 days- was out of medication.      Review of Systems  Constitutional: Negative for fever and unexpected weight change.  HENT: Negative for congestion.   Eyes: Negative for visual disturbance.  Respiratory: Negative for cough.   Cardiovascular: Negative for chest pain.  Gastrointestinal: Negative for nausea and vomiting.  Genitourinary: Negative for vaginal discharge.  Musculoskeletal: Negative for myalgias and arthralgias.  Skin: Negative for rash.  Neurological: Negative for headaches.  Hematological: Negative for adenopathy.  Psychiatric/Behavioral:       Denies depression, anxiety   Past Medical History  Diagnosis Date  . Abnormal glucose   . Hyperlipidemia   . Hypertension   . History of hemorrhoids   . Lipoma     History   Social History  . Marital Status: Married    Spouse Name: N/A    Number of Children: N/A  . Years of Education: N/A   Occupational History  . Not on file.   Social History Main Topics  . Smoking status: Never Smoker   . Smokeless tobacco: Not on file  . Alcohol Use: Not on file  . Drug Use: Not on file  . Sexually Active: Not on file   Other Topics Concern  . Not on file   Social History Narrative   Occupation: Housewife   Married   Never Smoked   Alcohol use-no   Daughter - Junious Dresser (8y/o)    No past surgical history on file.  Family History  Problem Relation Age of Onset  . Hypertension Mother   .  Kidney disease Mother     chronic renal insufficiency or some manner of kidney abnormality  . Coronary artery disease      sibling  . Hypothyroidism Mother     s/p thryroid surgery and is hypothyroid  . Other      denies FH of colon cancer,and diabetes    No Known Allergies  Current Outpatient Prescriptions on File Prior to Visit  Medication Sig Dispense Refill  . amLODipine (NORVASC) 5 MG tablet Take 1 tablet (5 mg total) by mouth daily.  30 tablet  2  . colesevelam (WELCHOL) 625 MG tablet Take 3 tablets (1,875 mg total) by mouth 2 (two) times daily.  180 tablet  5  . glucose blood (FREESTYLE LITE) test strip Use to check blood sugar once a day       . levothyroxine (SYNTHROID) 75 MCG tablet Take 1 tablet (75 mcg total) by mouth daily.  30 tablet  1    BP 136/84  Pulse 71  Temp(Src) 97.5 F (36.4 C) (Oral)  Resp 16  Ht 5\' 3"  (1.6 m)  Wt 172 lb 0.6 oz (78.037 kg)  BMI 30.48 kg/m2  SpO2 99%  LMP 04/24/2011       Objective:   Physical Exam  Physical Exam  Constitutional: She is oriented  to person, place, and time. She appears well-developed and well-nourished. No distress.  HENT:  Head: Normocephalic and atraumatic.  Right Ear: Tympanic membrane and ear canal normal.  Left Ear: Tympanic membrane and ear canal normal.  Mouth/Throat: Oropharynx is clear and moist.  Eyes: Pupils are equal, round, and reactive to light. No scleral icterus.  Neck: Normal range of motion. No thyromegaly present.  Cardiovascular: Normal rate and regular rhythm.   No murmur heard. Pulmonary/Chest: Effort normal and breath sounds normal. No respiratory distress. He has no wheezes. She has no rales. She exhibits no tenderness.  Abdominal: Soft. Bowel sounds are normal. He exhibits no distension and no mass. There is no tenderness. There is no rebound and no guarding.  Musculoskeletal: She exhibits no edema.  Lymphadenopathy:    She has no cervical adenopathy.  Neurological: She is alert and  oriented to person, place, and time. She has normal reflexes. She exhibits normal muscle tone. Coordination normal.  Skin: Skin is warm and dry.  Psychiatric: She has a normal mood and affect. Her behavior is normal. Judgment and thought content normal.  Breast/pelvic- deferred to GYN.     Assessment & Plan:         Assessment & Plan:

## 2011-05-20 NOTE — Assessment & Plan Note (Signed)
She is off of statin.  Taking 2 caps welchol bid, obtain flp.

## 2011-05-20 NOTE — Assessment & Plan Note (Signed)
Pt counseled on diet, exercise, weight loss. Tdap today.  Mammogram to be scheduled on first floor today. She requests referral to GYN for pap.  Follow up in 3 months. Plan to start colo at age 46.

## 2011-05-20 NOTE — Assessment & Plan Note (Signed)
Will obtain A1C today due to note of hyperglycemia last visit.

## 2011-05-20 NOTE — Assessment & Plan Note (Signed)
Clinically stable on current dose of synthroid. Continue same.  °

## 2011-05-21 ENCOUNTER — Telehealth: Payer: Self-pay | Admitting: Family

## 2011-05-21 LAB — URINALYSIS, ROUTINE W REFLEX MICROSCOPIC
Bilirubin Urine: NEGATIVE
Glucose, UA: NEGATIVE mg/dL
Specific Gravity, Urine: 1.014 (ref 1.005–1.030)
pH: 6 (ref 5.0–8.0)

## 2011-05-21 LAB — HEMOGLOBIN A1C
Hgb A1c MFr Bld: 7.8 % — ABNORMAL HIGH (ref <5.7)
Mean Plasma Glucose: 177 mg/dL — ABNORMAL HIGH (ref <117)

## 2011-05-21 MED ORDER — METFORMIN HCL 500 MG PO TABS: 500.0000 mg | ORAL_TABLET | Freq: Two times a day (BID) | ORAL | Status: DC

## 2011-05-21 MED ORDER — ATORVASTATIN CALCIUM 20 MG PO TABS: 20.0000 mg | ORAL_TABLET | Freq: Every day | ORAL | Status: DC

## 2011-05-21 NOTE — Telephone Encounter (Signed)
Please call pt and let her know that her cholesterol and triglycerides are extremely high.  I would like her to stop welchol, resume atorvastatin 20mg  daily.  Also sugar is up in diabetic range.  I would like for her to start metformin 500mg  bid for her sugar.  Work on diet, exercise, weight loss.  Follow up in 1 month (with interpreter) fasting for office visit and labs.

## 2011-05-21 NOTE — Telephone Encounter (Signed)
Left message on machine for pt to return my call  

## 2011-05-22 ENCOUNTER — Encounter: Payer: Self-pay | Admitting: Obstetrics & Gynecology

## 2011-05-23 NOTE — Telephone Encounter (Signed)
Notified pt and scheduled f/u for 06/24/11 at 9:45am. Telephone operator will arrange for interpreter.

## 2011-06-15 ENCOUNTER — Other Ambulatory Visit: Payer: Self-pay | Admitting: Family

## 2011-06-20 ENCOUNTER — Other Ambulatory Visit: Payer: Self-pay | Admitting: Obstetrics & Gynecology

## 2011-06-20 ENCOUNTER — Encounter: Payer: 59 | Admitting: Obstetrics & Gynecology

## 2011-06-20 ENCOUNTER — Encounter: Payer: Self-pay | Admitting: Obstetrics & Gynecology

## 2011-06-20 ENCOUNTER — Ambulatory Visit (INDEPENDENT_AMBULATORY_CARE_PROVIDER_SITE_OTHER): Payer: 59 | Admitting: Obstetrics & Gynecology

## 2011-06-20 VITALS — BP 124/78 | HR 79 | Temp 97.5°F | Ht 62.5 in | Wt 171.5 lb

## 2011-06-20 DIAGNOSIS — Z01419 Encounter for gynecological examination (general) (routine) without abnormal findings: Secondary | ICD-10-CM

## 2011-06-20 DIAGNOSIS — N938 Other specified abnormal uterine and vaginal bleeding: Secondary | ICD-10-CM

## 2011-06-20 DIAGNOSIS — N926 Irregular menstruation, unspecified: Secondary | ICD-10-CM

## 2011-06-20 DIAGNOSIS — Z0000 Encounter for general adult medical examination without abnormal findings: Secondary | ICD-10-CM

## 2011-06-20 NOTE — Progress Notes (Signed)
Period started 05/16/11 ended 05/20/11 heavy bleeding  Then 05/26/11 started heavily bleeding 06/02/11.

## 2011-06-20 NOTE — Progress Notes (Signed)
Subjective:    Alexandria Ayers is a 46 y.o. female who presents for an annual exam. She complains of having 2 periods per month about 3 months this past year. The patient is sexually active. GYN screening history: last pap: was normal. The patient wears seatbelts: yes. The patient participates in regular exercise: not asked. Has the patient ever been transfused or tattooed?: no. The patient reports that there is not domestic violence in her life.   Menstrual History: OB History    Grav Para Term Preterm Abortions TAB SAB Ect Mult Living                  Menarche age: 18 Patient's last menstrual period was 05/20/2011.    The following portions of the patient's history were reviewed and updated as appropriate: allergies, current medications, past family history, past medical history, past social history, past surgical history and problem list.  Review of Systems A comprehensive review of systems was negative. She is a housewife   Objective:    BP 124/78  Pulse 79  Temp(Src) 97.5 F (36.4 C) (Oral)  Ht 5' 2.5" (1.588 m)  Wt 171 lb 8 oz (77.792 kg)  BMI 30.87 kg/m2  LMP 05/20/2011  General Appearance:    Alert, cooperative, no distress, appears stated age  Head:    Normocephalic, without obvious abnormality, atraumatic  Eyes:    PERRL, conjunctiva/corneas clear, EOM's intact, fundi    benign, both eyes  Ears:    Normal TM's and external ear canals, both ears  Nose:   Nares normal, septum midline, mucosa normal, no drainage    or sinus tenderness  Throat:   Lips, mucosa, and tongue normal; teeth and gums normal  Neck:   Supple, symmetrical, trachea midline, no adenopathy;    thyroid:  no enlargement/tenderness/nodules; no carotid   bruit or JVD  Back:     Symmetric, no curvature, ROM normal, no CVA tenderness  Lungs:     Clear to auscultation bilaterally, respirations unlabored  Chest Wall:    No tenderness or deformity   Heart:    Regular rate and rhythm, S1 and S2 normal, no  murmur, rub   or gallop  Breast Exam:    No tenderness, masses, or nipple abnormality  Abdomen:     Soft, non-tender, bowel sounds active all four quadrants,    no masses, no organomegaly  Genitalia:    Normal female without lesion, discharge or tenderness, uterus 12 week size, mobile, NT, no adnexal masses     Extremities:   Extremities normal, atraumatic, no cyanosis or edema  Pulses:   2+ and symmetric all extremities  Skin:   Skin color, texture, turgor normal, no rashes or lesions  Lymph nodes:   Cervical, supraclavicular, and axillary nodes normal  Neurologic:   CNII-XII intact, normal strength, sensation and reflexes    throughout  .    Assessment:    Healthy female exam.  Uterine enlargement   Plan:     Pap smear.  Schedule gyn [place]

## 2011-06-24 ENCOUNTER — Ambulatory Visit (INDEPENDENT_AMBULATORY_CARE_PROVIDER_SITE_OTHER): Payer: 59 | Admitting: Family

## 2011-06-24 ENCOUNTER — Encounter: Payer: Self-pay | Admitting: Family

## 2011-06-24 VITALS — BP 110/86 | HR 78 | Temp 98.0°F | Resp 16 | Ht 63.0 in | Wt 172.0 lb

## 2011-06-24 DIAGNOSIS — E785 Hyperlipidemia, unspecified: Secondary | ICD-10-CM

## 2011-06-24 DIAGNOSIS — E039 Hypothyroidism, unspecified: Secondary | ICD-10-CM

## 2011-06-24 DIAGNOSIS — E1165 Type 2 diabetes mellitus with hyperglycemia: Secondary | ICD-10-CM | POA: Insufficient documentation

## 2011-06-24 DIAGNOSIS — E119 Type 2 diabetes mellitus without complications: Secondary | ICD-10-CM

## 2011-06-24 DIAGNOSIS — E1129 Type 2 diabetes mellitus with other diabetic kidney complication: Secondary | ICD-10-CM | POA: Insufficient documentation

## 2011-06-24 LAB — HEPATIC FUNCTION PANEL
AST: 21 U/L (ref 0–37)
Albumin: 4.1 g/dL (ref 3.5–5.2)
Alkaline Phosphatase: 58 U/L (ref 39–117)
Indirect Bilirubin: 0.4 mg/dL (ref 0.0–0.9)
Total Bilirubin: 0.5 mg/dL (ref 0.3–1.2)

## 2011-06-24 LAB — LIPID PANEL
HDL: 42 mg/dL (ref >39)
Triglycerides: 223 mg/dL — ABNORMAL HIGH (ref <150)

## 2011-06-24 NOTE — Assessment & Plan Note (Signed)
Clinically stable on synthroid. TSH normal 1 month ago.

## 2011-06-24 NOTE — Assessment & Plan Note (Signed)
Severe dyslipidemia, now back on statin, obtain lipid panel.  Hopefully will be improved.

## 2011-06-24 NOTE — Patient Instructions (Signed)
Please complete your lab work prior to leaving. Follow up in 2 months. Work hard on diet, exercise and weight loss.

## 2011-06-24 NOTE — Progress Notes (Signed)
Subjective:    Patient ID: Alexandria Ayers, female    DOB: July 04, 1965, 46 y.o.   MRN: [id number]  HPI  Ms.  Ayers is a 46 yr old female who presents today for follow up.  1) DM2-  Last visit A1C was noted to be elevated at 7.8.  She was placed on metformin- but she did not start the metformin.  She has been been walking and watching her diet.    2)Hyperlipidemia/Hypertriglyceridemia- She was placed back on atorvastatin last visit due to elevation of cholesterol and severe hypertrigylceridemia.  She tells me that she did restart this medication.  She tells me she had one bite of rice this AM.  3)Hypothyroid- She continues sythroid 75 mcg.   Review of Systems See HPI  Past Medical History  Diagnosis Date  . Abnormal glucose   . Hyperlipidemia   . Hypertension   . History of hemorrhoids   . Lipoma   . Obesity     History   Social History  . Marital Status: Married    Spouse Name: N/A    Number of Children: N/A  . Years of Education: N/A   Occupational History  . Not on file.   Social History Main Topics  . Smoking status: Never Smoker   . Smokeless tobacco: Not on file  . Alcohol Use: Not on file  . Drug Use: Not on file  . Sexually Active: Not on file   Other Topics Concern  . Not on file   Social History Narrative   Occupation: Housewife   Married   Never Smoked   Alcohol use-no   Daughter - Alexandria Ayers (8y/o)    No past surgical history on file.  Family History  Problem Relation Age of Onset  . Hypertension Mother   . Kidney disease Mother     chronic renal insufficiency or some manner of kidney abnormality  . Coronary artery disease      sibling  . Hypothyroidism Mother     s/p thryroid surgery and is hypothyroid  . Other      denies FH of colon cancer,and diabetes    No Known Allergies  Current Outpatient Prescriptions on File Prior to Visit  Medication Sig Dispense Refill  . amLODipine (NORVASC) 5 MG tablet Take 1 tablet (5 mg total) by mouth daily.   30 tablet  2  . atorvastatin (LIPITOR) 20 MG tablet Take 1 tablet (20 mg total) by mouth daily.  30 tablet  2  . glucose blood (FREESTYLE LITE) test strip Use to check blood sugar once a day       . levothyroxine (SYNTHROID, LEVOTHROID) 75 MCG tablet TAKE 1 TABLET (75 MCG TOTAL) BY MOUTH DAILY.  30 tablet  3  . metFORMIN (GLUCOPHAGE) 500 MG tablet Take 1 tablet (500 mg total) by mouth 2 (two) times daily with a meal.  60 tablet  2    BP 110/86  Pulse 78  Temp(Src) 98 F (36.7 C) (Oral)  Resp 16  Ht 5\' 3"  (1.6 m)  Wt 172 lb (78.019 kg)  BMI 30.47 kg/m2  SpO2 98%  LMP 05/20/2011       Objective:   Physical Exam  Constitutional: She appears well-developed and well-nourished.  Cardiovascular: Normal rate and regular rhythm.   No murmur heard. Pulmonary/Chest: Effort normal and breath sounds normal. No respiratory distress. She has no wheezes. She has no rales. She exhibits no tenderness.  Musculoskeletal: She exhibits no edema.  Psychiatric: She has a  normal mood and affect. Her behavior is normal. Judgment and thought content normal.          Assessment & Plan:  A [place] interpreter was present for interview and exam.

## 2011-06-24 NOTE — Assessment & Plan Note (Signed)
46 yr old female, now with DM2-  Last A1C 7.8.  She refuses to start metformin.  Wants to start with diet, exercise, weight loss first.  She is agreeable to start metformin if no improvement in 2 months. Declines pneumovax today.

## 2011-06-25 ENCOUNTER — Encounter: Payer: Self-pay | Admitting: Family

## 2011-06-26 ENCOUNTER — Ambulatory Visit (HOSPITAL_COMMUNITY)
Admission: RE | Admit: 2011-06-26 | Discharge: 2011-06-26 | Disposition: A | Discharge status: Home / Self Care | Payer: 59 | Source: Ambulatory Visit | Attending: Obstetrics & Gynecology | Admitting: Obstetrics & Gynecology

## 2011-06-26 ENCOUNTER — Other Ambulatory Visit: Payer: Self-pay

## 2011-06-26 DIAGNOSIS — D251 Intramural leiomyoma of uterus: Secondary | ICD-10-CM | POA: Insufficient documentation

## 2011-06-26 DIAGNOSIS — N938 Other specified abnormal uterine and vaginal bleeding: Secondary | ICD-10-CM

## 2011-06-26 DIAGNOSIS — N949 Unspecified condition associated with female genital organs and menstrual cycle: Secondary | ICD-10-CM | POA: Insufficient documentation

## 2011-07-18 ENCOUNTER — Ambulatory Visit (INDEPENDENT_AMBULATORY_CARE_PROVIDER_SITE_OTHER): Payer: 59 | Admitting: Family Medicine

## 2011-07-18 ENCOUNTER — Encounter: Payer: Self-pay | Admitting: Family Medicine

## 2011-07-18 VITALS — BP 132/88 | HR 75 | Temp 97.4°F | Ht 64.0 in | Wt 168.6 lb

## 2011-07-18 DIAGNOSIS — N926 Irregular menstruation, unspecified: Secondary | ICD-10-CM

## 2011-07-18 NOTE — Patient Instructions (Signed)
Intrauterine Device Information An intrauterine device (IUD) is inserted into your uterus and prevents pregnancy. There are 2 types of IUDs available:  Copper IUD. This type of IUD is wrapped in copper wire and is placed inside the uterus. Copper makes the uterus and fallopian tubes produce a fluid that kills sperm. The copper IUD can stay in place for 10 years.   Hormone IUD. This type of IUD contains the hormone progestin (synthetic progesterone). The hormone thickens the cervical mucus and prevents sperm from entering the uterus, and it also thins the uterine lining to prevent implantation of a fertilized egg. The hormone can weaken or kill the sperm that get into the uterus. The hormone IUD can stay in place for 5 years.  Your caregiver will make sure you are a good candidate for a contraceptive IUD. Discuss with your caregiver the possible side effects. ADVANTAGES  It is highly effective, reversible, long-acting, and low maintenance.   There are no estrogen-related side effects.   An IUD can be used when breastfeeding.   It is not associated with weight gain.   It works immediately after insertion.   The copper IUD does not interfere with your female hormones.   The progesterone IUD can make heavy menstrual periods lighter.   The progesterone IUD can be used for 5 years.   The copper IUD can be used for 10 years.  DISADVANTAGES  The progesterone IUD can be associated with irregular bleeding patterns.   The copper IUD can make your menstrual flow heavier and more painful.   You may experience cramping and vaginal bleeding after insertion.  Document Released: 02/06/2004 Document Revised: 02/21/2011 Document Reviewed: 07/07/2010 [facility] Patient Information 2012 [place], [place].   Endometrial Ablation Endometrial ablation removes the lining of the uterus (endometrium). It is usually a same day, outpatient treatment. Ablation helps avoid major surgery (such as a hysterectomy).  A hysterectomy is removal of the cervix and uterus. Endometrial ablation has less risk and complications, has a shorter recovery period and is less expensive. After endometrial ablation, most women will have little or no menstrual bleeding. You may not keep your fertility. Pregnancy is no longer likely after this procedure but if you are pre-menopausal, you still need to use a reliable method of birth control following the procedure because pregnancy can occur. REASONS TO HAVE THE PROCEDURE MAY INCLUDE:  Heavy periods.   Bleeding that is causing anemia.   Anovulatory bleeding, very irregular, bleeding.   Bleeding submucous fibroids (on the lining inside the uterus) if they are smaller than 3 centimeters.  REASONS NOT TO HAVE THE PROCEDURE MAY INCLUDE:  You wish to have more children.   You have a pre-cancerous or cancerous problem. The cause of any abnormal bleeding must be diagnosed before having the procedure.   You have pain coming from the uterus.   You have a submucus fibroid larger than 3 centimeters.   You recently had a baby.   You recently had an infection in the uterus.   You have a severe retro-flexed, tipped uterus and cannot insert the instrument to do the ablation.   You had a Cesarean section or deep major surgery on the uterus.   The inner cavity of the uterus is too large for the endometrial ablation instrument.  RISKS AND COMPLICATIONS   Perforation of the uterus.   Bleeding.   Infection of the uterus, bladder or vagina.   Injury to surrounding organs.   Cutting the cervix.   An air  bubble to the lung (air embolus).   Pregnancy following the procedure.   Failure of the procedure to help the problem requiring hysterectomy.   Decreased ability to diagnose cancer in the lining of the uterus.  BEFORE THE PROCEDURE  The lining of the uterus must be tested to make sure there is no pre-cancerous or cancer cells present.   Medications may be given to make  the lining of the uterus thinner.   Ultrasound may be used to evaluate the size and look for abnormalities of the uterus.   Future pregnancy is not desired.  PROCEDURE  There are different ways to destroy the lining of the uterus.   Resectoscope - radio frequency-alternating electric current is the most common one used.   Cryotherapy - freezing the lining of the uterus.   Heated Free Liquid - heated salt (saline) solution inserted into the uterus.   Microwave - uses high energy microwaves in the uterus.   Thermal Balloon - a catheter with a balloon tip is inserted into the uterus and filled with heated fluid.  Your caregiver will talk with you about the method used in this clinic. They will also instruct you on the pros and cons of the procedure. Endometrial ablation is performed along with a procedure called operative hysteroscopy. A narrow viewing tube is inserted through the birth canal (vagina) and through the cervix into the uterus. A tiny camera attached to the viewing tube (hysteroscope) allows the uterine cavity to be shown on a TV monitor during surgery. Your uterus is filled with a harmless liquid to make the procedure easier. The lining of the uterus is then removed. The lining can also be removed with a resectoscope which allows your surgeon to cut away the lining of the uterus under direct vision. Usually, you will be able to go home within an hour after the procedure. HOME CARE INSTRUCTIONS   Do not drive for 24 hours.   No tampons, douching or intercourse for 2 weeks or until your caregiver approves.   Rest at home for 24 to 48 hours. You may then resume normal activities unless told differently by your caregiver.   Take your temperature two times a day for 4 days, and record it.   Take any medications your caregiver has ordered, as directed.   Use some form of contraception if you are pre-menopausal and do not want to get pregnant.  Bleeding after the procedure is  normal. It varies from light spotting and mildly watery to bloody discharge for 4 to 6 weeks. You may also have mild cramping. Only take over-the-counter or prescription medicines for pain, discomfort, or fever as directed by your caregiver. Do not use aspirin, as this may aggravate bleeding. Frequent urination during the first 24 hours is normal. You will not know how effective your surgery is until at least 3 months after the surgery. SEEK IMMEDIATE MEDICAL CARE IF:   Bleeding is heavier than a normal menstrual cycle.   An oral temperature above 102 F (38.9 C) develops.   You have increasing cramps or pains not relieved with medication or develop belly (abdominal) pain which does not seem to be related to the same area of earlier cramping and pain.   You are light headed, weak or have fainting episodes.   You develop pain in the shoulder strap areas.   You have chest or leg pain.   You have abnormal vaginal discharge.   You have painful urination.  Document Released: 01/12/2004 Document  Revised: 02/21/2011 Document Reviewed: 04/11/2007 [facility] Patient Information 2012 [place], [place].

## 2011-07-18 NOTE — Progress Notes (Signed)
  Subjective:    Patient ID: Alexandria Ayers, female    DOB: Jan 03, 1966, 46 y.o.   MRN: [id number]  HPI Patient seen for DUB.  Patient here for [place] results.  Bleeding pattern has not changed.  US showed 1.4cm intramural fibroid with endometrium of 4mm.  Patient seen with interpreter.   Review of Systems     Objective:   Physical Exam  Constitutional: She is oriented to person, place, and time. She appears well-developed and well-nourished.  Neurological: She is alert and oriented to person, place, and time.  Psychiatric: She has a normal mood and affect. Her behavior is normal. Judgment and thought content normal.      Assessment & Plan:  1.  DUB As endometrium 4mm, endometrial biopsy would be of little yield.  Discussed options with patient: Depo Provera, Mirena, ablation.  Patient would like to consider options and do nothing at present.  Counseled pt to return if symptoms progress or worsen.

## 2011-07-27 ENCOUNTER — Other Ambulatory Visit: Payer: Self-pay | Admitting: Family

## 2011-08-18 ENCOUNTER — Other Ambulatory Visit: Payer: Self-pay | Admitting: Family

## 2011-08-19 NOTE — Telephone Encounter (Signed)
OK to send 30 tabs, but needs to be seen back in office prior to additional refills please.

## 2011-08-19 NOTE — Telephone Encounter (Signed)
Received request from pharmacy for metformin and atorvastatin. Pt was due for follow up in June but did not keep appt. Pt is requesting to start metformin, is it ok to provide refill?

## 2011-08-20 ENCOUNTER — Ambulatory Visit (INDEPENDENT_AMBULATORY_CARE_PROVIDER_SITE_OTHER): Payer: 59 | Admitting: Family

## 2011-08-20 ENCOUNTER — Encounter: Payer: Self-pay | Admitting: Family

## 2011-08-20 VITALS — BP 130/84 | HR 68 | Temp 97.7°F | Resp 16 | Ht 63.0 in | Wt 165.0 lb

## 2011-08-20 DIAGNOSIS — E039 Hypothyroidism, unspecified: Secondary | ICD-10-CM

## 2011-08-20 DIAGNOSIS — E041 Nontoxic single thyroid nodule: Secondary | ICD-10-CM

## 2011-08-20 DIAGNOSIS — E119 Type 2 diabetes mellitus without complications: Secondary | ICD-10-CM

## 2011-08-20 LAB — HEMOGLOBIN A1C: Hgb A1c MFr Bld: 8.8 % — ABNORMAL HIGH (ref <5.7)

## 2011-08-20 NOTE — Progress Notes (Signed)
  Subjective:    Patient ID: Alexandria Ayers, female    DOB: December 23, 1965, 46 y.o.   MRN: [id number]  HPI  Alexandria Ayers is a 46 yr old female who presents today for follow up.  She is accompanied by a [place] interpreter.   DM2- reports that her sugar this AM 200.  She reports some thirst.  Reports mild polyuria.  Post prandial sugar in the high 100's.    Review of Systems See HPI  Past Medical History  Diagnosis Date  . Abnormal glucose   . Hyperlipidemia   . Hypertension   . History of hemorrhoids   . Lipoma   . Obesity     History   Social History  . Marital Status: Married    Spouse Name: N/A    Number of Children: N/A  . Years of Education: N/A   Occupational History  . Not on file.   Social History Main Topics  . Smoking status: Never Smoker   . Smokeless tobacco: Not on file  . Alcohol Use: Not on file  . Drug Use: Not on file  . Sexually Active: Not on file   Other Topics Concern  . Not on file   Social History Narrative   Occupation: Housewife   Married   Never Smoked   Alcohol use-no   Daughter - Junious Dresser (8y/o)    No past surgical history on file.  Family History  Problem Relation Age of Onset  . Hypertension Mother   . Kidney disease Mother     chronic renal insufficiency or some manner of kidney abnormality  . Coronary artery disease      sibling  . Hypothyroidism Mother     s/p thryroid surgery and is hypothyroid  . Other      denies FH of colon cancer,and diabetes    No Known Allergies  Current Outpatient Prescriptions on File Prior to Visit  Medication Sig Dispense Refill  . amLODipine (NORVASC) 5 MG tablet TAKE 1 TABLET (5 MG TOTAL) BY MOUTH DAILY.  30 tablet  2  . atorvastatin (LIPITOR) 20 MG tablet TAKE 1 TABLET (20 MG TOTAL) BY MOUTH DAILY.  30 tablet  0  . glucose blood (FREESTYLE LITE) test strip Use to check blood sugar once a day       . levothyroxine (SYNTHROID, LEVOTHROID) 75 MCG tablet TAKE 1 TABLET (75 MCG TOTAL) BY MOUTH DAILY.   30 tablet  3  . metFORMIN (GLUCOPHAGE) 500 MG tablet TAKE 1 TABLET (500 MG TOTAL) BY MOUTH 2 (TWO) TIMES DAILY WITH A MEAL.  60 tablet  0    BP 130/84  Pulse 68  Temp(Src) 97.7 F (36.5 C) (Oral)  Resp 16  Ht 5\' 3"  (1.6 m)  Wt 165 lb (74.844 kg)  BMI 29.23 kg/m2  SpO2 99%       Objective:   Physical Exam  Constitutional: She appears well-developed and well-nourished. No distress.  Cardiovascular: Normal rate and regular rhythm.   No murmur heard. Pulmonary/Chest: Effort normal and breath sounds normal. No respiratory distress. She has no wheezes. She has no rales. She exhibits no tenderness.  Musculoskeletal: She exhibits no edema.  Skin: Skin is warm and dry.  Psychiatric: She has a normal mood and affect. Her behavior is normal. Judgment and thought content normal.          Assessment & Plan:

## 2011-08-20 NOTE — Assessment & Plan Note (Signed)
Clinically unchanged.  Obtain A1C- if >7, then will plan to start metformin.  Pt is agreeable to this plan.

## 2011-08-20 NOTE — Patient Instructions (Signed)
Please schedule your thyroid ultrasound on the first floor.  Complete your blood work prior to leaving. Please schedule a follow up appointment in 3 months.

## 2011-08-21 ENCOUNTER — Ambulatory Visit (HOSPITAL_BASED_OUTPATIENT_CLINIC_OR_DEPARTMENT_OTHER)
Admission: RE | Admit: 2011-08-21 | Discharge: 2011-08-21 | Disposition: A | Discharge status: Home / Self Care | Payer: 59 | Source: Ambulatory Visit | Attending: Family | Admitting: Family

## 2011-08-21 ENCOUNTER — Telehealth: Payer: Self-pay | Admitting: Family

## 2011-08-21 DIAGNOSIS — E041 Nontoxic single thyroid nodule: Secondary | ICD-10-CM | POA: Insufficient documentation

## 2011-08-21 NOTE — Telephone Encounter (Addendum)
Please call pt and let her know that her diabetes looks worse.  A1C is up to 8.8.  She should start metformin.  This should be at her pharmacy if she has not already picked up.  Also, her thyroid ultrasound is stable.  Thyroid nodule has not changed in size.

## 2011-08-21 NOTE — Telephone Encounter (Signed)
Call placed to patient at [phone], female stated patient was not available. Message was left for patient to return phone call regarding lab results.

## 2011-08-28 NOTE — Telephone Encounter (Signed)
Left message on home # to return my call. 

## 2011-09-06 ENCOUNTER — Encounter: Payer: Self-pay | Admitting: *Deleted

## 2011-09-06 NOTE — Telephone Encounter (Signed)
Unable to reach pt by phone, mailed detailed letter of results.

## 2011-10-12 ENCOUNTER — Other Ambulatory Visit: Payer: Self-pay | Admitting: Family

## 2011-10-29 ENCOUNTER — Other Ambulatory Visit: Payer: Self-pay | Admitting: Family

## 2011-11-08 ENCOUNTER — Other Ambulatory Visit: Payer: Self-pay | Admitting: Family

## 2012-01-01 ENCOUNTER — Other Ambulatory Visit: Payer: Self-pay | Admitting: Family

## 2012-01-01 NOTE — Telephone Encounter (Signed)
Spoke with patient and she states that she does not speak english very well. She gave me a verbal ok to call her husband to explain the reason for my call.   I spoke to her husband and informed him that a refill of levothyroxine has been sent in to the pharmacy and he did schedule an appointment for patient for 01/10/12. I will order an intrepretor.

## 2012-01-01 NOTE — Telephone Encounter (Signed)
Levothyroxine refill sent to pharmacy #[id number] x no refills. Pt was due for follow up in September. Please call pt to arrange follow up.

## 2012-01-10 ENCOUNTER — Encounter: Payer: Self-pay | Admitting: Family

## 2012-01-10 ENCOUNTER — Ambulatory Visit (INDEPENDENT_AMBULATORY_CARE_PROVIDER_SITE_OTHER): Payer: 59 | Admitting: Family

## 2012-01-10 VITALS — BP 120/84 | HR 65 | Temp 97.8°F | Resp 18 | Ht 63.0 in | Wt 157.1 lb

## 2012-01-10 DIAGNOSIS — I10 Essential (primary) hypertension: Secondary | ICD-10-CM

## 2012-01-10 DIAGNOSIS — E041 Nontoxic single thyroid nodule: Secondary | ICD-10-CM

## 2012-01-10 DIAGNOSIS — E039 Hypothyroidism, unspecified: Secondary | ICD-10-CM

## 2012-01-10 DIAGNOSIS — E119 Type 2 diabetes mellitus without complications: Secondary | ICD-10-CM

## 2012-01-10 LAB — BASIC METABOLIC PANEL WITH GFR
BUN: 11 mg/dL (ref 6–23)
Calcium: 9.1 mg/dL (ref 8.4–10.5)
Creat: 0.48 mg/dL — ABNORMAL LOW (ref 0.50–1.10)
GFR, Est African American: >89 mL/min
GFR, Est Non African American: >89 mL/min

## 2012-01-10 LAB — HEMOGLOBIN A1C
Hgb A1c MFr Bld: 6.9 % — ABNORMAL HIGH (ref <5.7)
Mean Plasma Glucose: 151 mg/dL — ABNORMAL HIGH (ref <117)

## 2012-01-10 MED ORDER — AMLODIPINE BESYLATE 5 MG PO TABS: 5.0000 mg | ORAL_TABLET | Freq: Every day | ORAL | Status: DC

## 2012-01-10 MED ORDER — LEVOTHYROXINE SODIUM 75 MCG PO TABS: 75.0000 ug | ORAL_TABLET | Freq: Every day | ORAL | Status: DC

## 2012-01-10 MED ORDER — ATORVASTATIN CALCIUM 20 MG PO TABS: 20.0000 mg | ORAL_TABLET | Freq: Every day | ORAL | Status: DC

## 2012-01-10 NOTE — Progress Notes (Signed)
Subjective:    Patient ID: Alexandria Ayers, female    DOB: 01-11-1966, 46 y.o.   MRN: [id number]  HPI  Alexandria Ayers is a 46 yr old female who presents today for follow up.  1) HTN- She continues amlodipine.    2) DM2-  A1C last visit was noted to be 8.8. She did not return our phone calls. She tells me that she does not check her messages. Also she tells me that she did not receive our letter advising her to start metformin. Reports that has been working hard on diet and exercise and has lost some weight.   3) Hypothyroid- she continues synthroid. She is requesting refills.   Review of Systems    see HPI  Past Medical History  Diagnosis Date  . Abnormal glucose   . Hyperlipidemia   . Hypertension   . History of hemorrhoids   . Lipoma   . Obesity     History   Social History  . Marital Status: Married    Spouse Name: N/A    Number of Children: N/A  . Years of Education: N/A   Occupational History  . Not on file.   Social History Main Topics  . Smoking status: Never Smoker   . Smokeless tobacco: Not on file  . Alcohol Use: Not on file  . Drug Use: Not on file  . Sexually Active: Not on file   Other Topics Concern  . Not on file   Social History Narrative   Occupation: Housewife   Married   Never Smoked   Alcohol use-no   Daughter - Alexandria Ayers (8y/o)    No past surgical history on file.  Family History  Problem Relation Age of Onset  . Hypertension Mother   . Kidney disease Mother     chronic renal insufficiency or some manner of kidney abnormality  . Coronary artery disease      sibling  . Hypothyroidism Mother     s/p thryroid surgery and is hypothyroid  . Other      denies FH of colon cancer,and diabetes    No Known Allergies  Current Outpatient Prescriptions on File Prior to Visit  Medication Sig Dispense Refill  . DISCONTD: amLODipine (NORVASC) 5 MG tablet TAKE 1 TABLET (5 MG TOTAL) BY MOUTH DAILY.  30 tablet  5  . DISCONTD: atorvastatin (LIPITOR) 20  MG tablet TAKE 1 TABLET BY MOUTH EVERY DAY  30 tablet  2  . DISCONTD: levothyroxine (SYNTHROID, LEVOTHROID) 75 MCG tablet TAKE 1 TABLET (75 MCG TOTAL) BY MOUTH DAILY.  14 tablet  0  . glucose blood (FREESTYLE LITE) test strip Use to check blood sugar once a day       . metFORMIN (GLUCOPHAGE) 500 MG tablet TAKE 1 TABLET (500 MG TOTAL) BY MOUTH 2 (TWO) TIMES DAILY WITH A MEAL.  60 tablet  2    BP 120/84  Pulse 65  Temp 97.8 F (36.6 C) (Oral)  Resp 18  Ht 5\' 3"  (1.6 m)  Wt 157 lb 1.3 oz (71.251 kg)  BMI 27.83 kg/m2  SpO2 99%    Objective:   Physical Exam  Constitutional: She appears well-developed and well-nourished. No distress.  Cardiovascular: Normal rate and regular rhythm.   No murmur heard. Pulmonary/Chest: Effort normal and breath sounds normal. No respiratory distress. She has no wheezes. She has no rales. She exhibits no tenderness.  Musculoskeletal: She exhibits no edema.  Psychiatric: She has a normal mood and affect. Her behavior  is normal. Judgment and thought content normal.          Assessment & Plan:

## 2012-01-10 NOTE — Patient Instructions (Addendum)
Please complete your blood work prior to leaving. Please schedule a follow up appointment in 3 months.  

## 2012-01-10 NOTE — Assessment & Plan Note (Addendum)
With the aid of a [place] interpreter, pt was instructed to start metformin.  I let her know that if A1C is much improved and it is no longer felt necessary that she take metformin, then we will call her and let her know. Obtain bmet and A1C, microalbumin. Add ASA 81mg .

## 2012-01-10 NOTE — Assessment & Plan Note (Signed)
Follow up 6/4: Stable exam. No interval change in the tiny left thyroid nodule

## 2012-01-10 NOTE — Assessment & Plan Note (Signed)
Stable.  I am hesitant to add ACE as she does not use birth control and continues her periods.

## 2012-01-10 NOTE — Assessment & Plan Note (Signed)
Obtain TSH °

## 2012-01-11 LAB — MICROALBUMIN / CREATININE URINE RATIO
Creatinine, Urine: 234 mg/dL
Microalb Creat Ratio: 21.9 mg/g (ref 0.0–30.0)
Microalb, Ur: 5.12 mg/dL — ABNORMAL HIGH (ref 0.00–1.89)

## 2012-01-14 ENCOUNTER — Encounter: Payer: Self-pay | Admitting: Family

## 2012-01-14 DIAGNOSIS — R809 Proteinuria, unspecified: Secondary | ICD-10-CM | POA: Insufficient documentation

## 2012-01-16 ENCOUNTER — Other Ambulatory Visit: Payer: Self-pay | Admitting: Family

## 2012-03-23 ENCOUNTER — Telehealth: Payer: Self-pay | Admitting: Family

## 2012-03-23 MED ORDER — LEVOTHYROXINE SODIUM 75 MCG PO TABS: 75.0000 ug | ORAL_TABLET | Freq: Every day | ORAL | Status: DC

## 2012-03-23 NOTE — Telephone Encounter (Signed)
Refill- levothyroxine tablet. Take one tablet ( total) by mouth daily. Qty 30 last fill 12.29.13

## 2012-03-23 NOTE — Telephone Encounter (Signed)
Refill sent to pharmacy. Pt will be due for office visit and thyroid check around the end of this month. Please call pt to arrange appt.

## 2012-03-24 NOTE — Telephone Encounter (Signed)
Informed patient of medication refill and she scheduled an appointment for 04/15/12

## 2012-04-15 ENCOUNTER — Ambulatory Visit: Payer: 59 | Admitting: Family

## 2012-04-20 ENCOUNTER — Ambulatory Visit (INDEPENDENT_AMBULATORY_CARE_PROVIDER_SITE_OTHER): Payer: 59 | Admitting: Family

## 2012-04-20 ENCOUNTER — Encounter: Payer: Self-pay | Admitting: Family

## 2012-04-20 VITALS — BP 120/80 | HR 75 | Temp 98.0°F | Resp 16 | Ht 63.0 in | Wt 161.1 lb

## 2012-04-20 DIAGNOSIS — R809 Proteinuria, unspecified: Secondary | ICD-10-CM

## 2012-04-20 DIAGNOSIS — I10 Essential (primary) hypertension: Secondary | ICD-10-CM

## 2012-04-20 DIAGNOSIS — E039 Hypothyroidism, unspecified: Secondary | ICD-10-CM

## 2012-04-20 DIAGNOSIS — E1029 Type 1 diabetes mellitus with other diabetic kidney complication: Secondary | ICD-10-CM

## 2012-04-20 DIAGNOSIS — E785 Hyperlipidemia, unspecified: Secondary | ICD-10-CM

## 2012-04-20 DIAGNOSIS — IMO0001 Reserved for inherently not codable concepts without codable children: Secondary | ICD-10-CM

## 2012-04-20 LAB — HEPATIC FUNCTION PANEL
ALT: 16 U/L (ref 0–35)
AST: 13 U/L (ref 0–37)
Alkaline Phosphatase: 48 U/L (ref 39–117)
Bilirubin, Direct: 0.1 mg/dL (ref 0.0–0.3)
Indirect Bilirubin: 0.4 mg/dL (ref 0.0–0.9)
Total Bilirubin: 0.5 mg/dL (ref 0.3–1.2)

## 2012-04-20 MED ORDER — LEVOTHYROXINE SODIUM 75 MCG PO TABS: 75.0000 ug | ORAL_TABLET | Freq: Every day | ORAL | Status: DC

## 2012-04-20 MED ORDER — AMLODIPINE BESYLATE 5 MG PO TABS: 5.0000 mg | ORAL_TABLET | Freq: Every day | ORAL | Status: DC

## 2012-04-20 NOTE — Assessment & Plan Note (Signed)
Currently off of meds.  Obtain A1C.

## 2012-04-20 NOTE — Patient Instructions (Addendum)
Please complete your blood work prior to leaving.  Follow up in 3 months. 

## 2012-04-20 NOTE — Progress Notes (Signed)
  Subjective:    Patient ID: Alexandria Ayers, female    DOB: [birth year], 47 y.o.   MRN: [id number]  HPI    Review of Systems     Objective:   Physical Exam        Assessment & Plan:  Declined flu and pneumovax.

## 2012-04-20 NOTE — Assessment & Plan Note (Signed)
BP Readings from Last 3 Encounters:  04/20/12 120/80  01/10/12 120/84  08/20/11 130/84   BP stable on current dose of amlodipine. Continue same.

## 2012-04-20 NOTE — Assessment & Plan Note (Addendum)
She continues levothyroxine.  Obtain tsh.

## 2012-04-20 NOTE — Progress Notes (Signed)
  Subjective:    Patient ID: Jena Gauss, female    DOB: 04/06/1965, 47 y.o.   MRN: [id number]  HPI  Ms.  Tabak is a 47 yr old female who presents today for follow up. She presents with a [place] interpreter.   1) HTN-currently on amlodipine. Not on birth control.    2) Hyperlipidemia- She is currently maintained on atorvastatin.   3) DM2- she is not taking metformin. "never took it."   Review of Systems    see HPI  Past Medical History  Diagnosis Date  . Abnormal glucose   . Hyperlipidemia   . Hypertension   . History of hemorrhoids   . Lipoma   . Obesity     History   Social History  . Marital Status: Married    Spouse Name: N/A    Number of Children: N/A  . Years of Education: N/A   Occupational History  . Not on file.   Social History Main Topics  . Smoking status: Never Smoker   . Smokeless tobacco: Not on file  . Alcohol Use: Not on file  . Drug Use: Not on file  . Sexually Active: Not on file   Other Topics Concern  . Not on file   Social History Narrative   Occupation: Housewife   Married   Never Smoked   Alcohol use-no   Daughter - Junious Dresser (8y/o)    No past surgical history on file.  Family History  Problem Relation Age of Onset  . Hypertension Mother   . Kidney disease Mother     chronic renal insufficiency or some manner of kidney abnormality  . Coronary artery disease      sibling  . Hypothyroidism Mother     s/p thryroid surgery and is hypothyroid  . Other      denies FH of colon cancer,and diabetes    No Known Allergies  Current Outpatient Prescriptions on File Prior to Visit  Medication Sig Dispense Refill  . amLODipine (NORVASC) 5 MG tablet Take 1 tablet (5 mg total) by mouth daily.  30 tablet  5  . atorvastatin (LIPITOR) 20 MG tablet Take 1 tablet (20 mg total) by mouth daily.  30 tablet  5  . levothyroxine (SYNTHROID, LEVOTHROID) 75 MCG tablet Take 1 tablet (75 mcg total) by mouth daily.  30 tablet  2  . aspirin EC 81 MG  tablet Take 81 mg by mouth daily.      Marland Kitchen glucose blood (FREESTYLE LITE) test strip Use to check blood sugar once a day       . metFORMIN (GLUCOPHAGE) 500 MG tablet TAKE 1 TABLET (500 MG TOTAL) BY MOUTH 2 (TWO) TIMES DAILY WITH A MEAL.  60 tablet  2    BP 120/80  Pulse 75  Temp 98 F (36.7 C) (Oral)  Resp 16  Ht 5\' 3"  (1.6 m)  Wt 161 lb 1.9 oz (73.084 kg)  BMI 28.54 kg/m2  SpO2 99%  LMP 04/06/2012    Objective:   Physical Exam        Assessment & Plan:

## 2012-04-22 ENCOUNTER — Encounter: Payer: Self-pay | Admitting: Family

## 2012-08-27 ENCOUNTER — Other Ambulatory Visit: Payer: Self-pay | Admitting: Family

## 2012-08-31 ENCOUNTER — Telehealth: Payer: Self-pay | Admitting: Family

## 2012-08-31 MED ORDER — ATORVASTATIN CALCIUM 20 MG PO TABS: 20.0000 mg | ORAL_TABLET | Freq: Every day | ORAL | Status: DC

## 2012-08-31 NOTE — Telephone Encounter (Signed)
Atorvastatin 20 mg tablet qty 30 take 1 tablet by mouth daily last fill 07-24-2012

## 2012-08-31 NOTE — Telephone Encounter (Signed)
rx sent to pharmacy by e-script  

## 2012-09-15 LAB — HM PAP SMEAR: HM Pap smear: NORMAL

## 2012-09-15 LAB — HM MAMMOGRAPHY: HM Mammogram: NORMAL

## 2012-12-31 ENCOUNTER — Other Ambulatory Visit: Payer: Self-pay | Admitting: Family

## 2012-12-31 NOTE — Telephone Encounter (Signed)
Rx request to pharmacy, 30-day supply;*PATIENT DUE FOR FOLLOW-UP OFFICE VISIT*/SLS  

## 2013-01-26 ENCOUNTER — Ambulatory Visit (INDEPENDENT_AMBULATORY_CARE_PROVIDER_SITE_OTHER): Payer: 59 | Admitting: Family

## 2013-01-26 ENCOUNTER — Other Ambulatory Visit (HOSPITAL_COMMUNITY)
Admission: RE | Admit: 2013-01-26 | Discharge: 2013-01-26 | Disposition: A | Discharge status: Home / Self Care | Payer: 59 | Source: Ambulatory Visit | Attending: Family | Admitting: Family

## 2013-01-26 ENCOUNTER — Encounter: Payer: Self-pay | Admitting: Family

## 2013-01-26 VITALS — BP 112/76 | HR 61 | Temp 97.9°F | Resp 16 | Ht 63.5 in | Wt 167.1 lb

## 2013-01-26 DIAGNOSIS — Z1151 Encounter for screening for human papillomavirus (HPV): Secondary | ICD-10-CM | POA: Insufficient documentation

## 2013-01-26 DIAGNOSIS — Z23 Encounter for immunization: Secondary | ICD-10-CM

## 2013-01-26 DIAGNOSIS — Z01419 Encounter for gynecological examination (general) (routine) without abnormal findings: Secondary | ICD-10-CM | POA: Insufficient documentation

## 2013-01-26 DIAGNOSIS — E1129 Type 2 diabetes mellitus with other diabetic kidney complication: Secondary | ICD-10-CM

## 2013-01-26 DIAGNOSIS — Z0000 Encounter for general adult medical examination without abnormal findings: Secondary | ICD-10-CM

## 2013-01-26 DIAGNOSIS — I10 Essential (primary) hypertension: Secondary | ICD-10-CM

## 2013-01-26 DIAGNOSIS — R809 Proteinuria, unspecified: Secondary | ICD-10-CM

## 2013-01-26 DIAGNOSIS — E785 Hyperlipidemia, unspecified: Secondary | ICD-10-CM

## 2013-01-26 DIAGNOSIS — E039 Hypothyroidism, unspecified: Secondary | ICD-10-CM

## 2013-01-26 DIAGNOSIS — E119 Type 2 diabetes mellitus without complications: Secondary | ICD-10-CM

## 2013-01-26 DIAGNOSIS — IMO0001 Reserved for inherently not codable concepts without codable children: Secondary | ICD-10-CM

## 2013-01-26 LAB — BASIC METABOLIC PANEL WITH GFR
BUN: 10 mg/dL (ref 6–23)
Calcium: 9.2 mg/dL (ref 8.4–10.5)
Chloride: 102 mEq/L (ref 96–112)
Creat: 0.47 mg/dL — ABNORMAL LOW (ref 0.50–1.10)
GFR, Est African American: >89 mL/min
GFR, Est Non African American: >89 mL/min
Sodium: 135 mEq/L (ref 135–145)

## 2013-01-26 LAB — CBC WITH DIFFERENTIAL/PLATELET
Basophils Absolute: 0.1 10*3/uL (ref 0.0–0.1)
Basophils Relative: 1 % (ref 0–1)
Eosinophils Absolute: 0.3 10*3/uL (ref 0.0–0.7)
Eosinophils Relative: 4 % (ref 0–5)
HCT: 38 % (ref 36.0–46.0)
Hemoglobin: 13.2 g/dL (ref 12.0–15.0)
Lymphocytes Relative: 35 % (ref 12–46)
Lymphs Abs: 2.5 10*3/uL (ref 0.7–4.0)
MCH: 30.6 pg (ref 26.0–34.0)
MCHC: 34.7 g/dL (ref 30.0–36.0)
MCV: 88 fL (ref 78.0–100.0)
Monocytes Absolute: 0.5 10*3/uL (ref 0.1–1.0)
Monocytes Relative: 7 % (ref 3–12)
Neutro Abs: 3.9 10*3/uL (ref 1.7–7.7)
Neutrophils Relative %: 53 % (ref 43–77)
Platelets: 312 10*3/uL (ref 150–400)
RBC: 4.32 MIL/uL (ref 3.87–5.11)
RDW: 13.5 % (ref 11.5–15.5)
WBC: 7.2 10*3/uL (ref 4.0–10.5)

## 2013-01-26 LAB — HEPATIC FUNCTION PANEL
Albumin: 4.3 g/dL (ref 3.5–5.2)
Alkaline Phosphatase: 50 U/L (ref 39–117)
Bilirubin, Direct: 0.1 mg/dL (ref 0.0–0.3)
Total Bilirubin: 0.8 mg/dL (ref 0.3–1.2)
Total Protein: 7 g/dL (ref 6.0–8.3)

## 2013-01-26 LAB — HEMOGLOBIN A1C: Hgb A1c MFr Bld: 7.1 % — ABNORMAL HIGH (ref <5.7)

## 2013-01-26 LAB — LIPID PANEL
Cholesterol: 178 mg/dL (ref 0–200)
HDL: 44 mg/dL (ref >39)
LDL Cholesterol: 104 mg/dL — ABNORMAL HIGH (ref 0–99)
Total CHOL/HDL Ratio: 4 Ratio
Triglycerides: 149 mg/dL (ref <150)
VLDL: 30 mg/dL (ref 0–40)

## 2013-01-26 LAB — TSH: TSH: 2.779 u[IU]/mL (ref 0.350–4.500)

## 2013-01-26 MED ORDER — ATORVASTATIN CALCIUM 20 MG PO TABS: 20.0000 mg | ORAL_TABLET | Freq: Every day | ORAL | Status: DC

## 2013-01-26 MED ORDER — AMLODIPINE BESYLATE 5 MG PO TABS: ORAL_TABLET | ORAL | Status: DC

## 2013-01-26 MED ORDER — LEVOTHYROXINE SODIUM 75 MCG PO TABS: 75.0000 ug | ORAL_TABLET | Freq: Every day | ORAL | Status: DC

## 2013-01-26 NOTE — Assessment & Plan Note (Signed)
Tolerating statin.  Check FLP/LFT

## 2013-01-26 NOTE — Assessment & Plan Note (Signed)
BP Readings from Last 3 Encounters:  01/26/13 112/76  04/20/12 120/80  01/10/12 120/84   BP stable on amlodipine, continue same.

## 2013-01-26 NOTE — Assessment & Plan Note (Signed)
On synthroid, check TSH.

## 2013-01-26 NOTE — Progress Notes (Signed)
Subjective:    Patient ID: Alexandria Ayers, female    DOB: 05-12-1965, 47 y.o.   MRN: [id number]  HPI  Patient presents today for complete physical.  Immunizations: tetanus up to date,  Would like flu shot Diet: she eats a lot of rice [place], vegetable, pork belly, beef chicken Exercise:  Walks 2-3 times a week.  1 hour at a time.   Colonoscopy: she reports that she had colo this past July in [place] reports normal no polyps.  Pap Smear:  She was told that they found "some virus" on her pap smear and it was recommended that she have follow up in the [place]. She does not have these results.    DM2- reports that she checks sugars sometimes in the morning.  Fasting generally around 130 but today 140 today. Reports normal eye exam 7/14 in [place].   Lab Results  Component Value Date   HGBA1C 7.0* 04/20/2012   Hypothyroid- she continues levothyroxine.  Hyperlipidemia- denies myalgia.  Reports + compliance, denies myalgia.  HTN- on amlodipine.       Review of Systems    see HPI  Past Medical History  Diagnosis Date  . Abnormal glucose   . Hyperlipidemia   . Hypertension   . History of hemorrhoids   . Lipoma   . Obesity     History   Social History  . Marital Status: Married    Spouse Name: N/A    Number of Children: N/A  . Years of Education: N/A   Occupational History  . Not on file.   Social History Main Topics  . Smoking status: Never Smoker   . Smokeless tobacco: Not on file  . Alcohol Use: Not on file  . Drug Use: Not on file  . Sexual Activity: Not on file   Other Topics Concern  . Not on file   Social History Narrative   Occupation: Housewife      Married      Never Smoked      Alcohol use-no      Daughter - Junious Dresser (8y/o)    History reviewed. No pertinent past surgical history.  Family History  Problem Relation Age of Onset  . Hypertension Mother   . Kidney disease Mother     chronic renal insufficiency or some manner of kidney abnormality  .  Coronary artery disease      sibling  . Hypothyroidism Mother     s/p thryroid surgery and is hypothyroid  . Other      denies FH of colon cancer,and diabetes    No Known Allergies  Current Outpatient Prescriptions on File Prior to Visit  Medication Sig Dispense Refill  . amLODipine (NORVASC) 5 MG tablet TAKE 1 TABLET (5 MG TOTAL) BY MOUTH DAILY.  30 tablet  3  . aspirin EC 81 MG tablet Take 81 mg by mouth daily.      Marland Kitchen atorvastatin (LIPITOR) 20 MG tablet Take 1 tablet (20 mg total) by mouth daily.  30 tablet  5  . glucose blood (FREESTYLE LITE) test strip Use to check blood sugar once a day       . levothyroxine (SYNTHROID, LEVOTHROID) 75 MCG tablet Take 1 tablet (75 mcg total) by mouth daily.  30 tablet  5   No current facility-administered medications on file prior to visit.    BP 112/76  Pulse 61  Temp(Src) 97.9 F (36.6 C) (Oral)  Resp 16  Ht 5' 3.5" (1.613 m)  Wt 167 lb 1.3 oz (75.787 kg)  BMI 29.13 kg/m2  SpO2 99%  LMP 01/05/2013    Objective:   Physical Exam  Physical Exam  Constitutional: She is oriented to person, place, and time. She appears well-developed and well-nourished. No distress.  HENT:  Head: Normocephalic and atraumatic.  Right Ear: Tympanic membrane and ear canal normal.  Left Ear: Tympanic membrane and ear canal normal.  Mouth/Throat: Oropharynx is clear and moist.  Eyes: Pupils are equal, round, and reactive to light. No scleral icterus.  Neck: Normal range of motion. No thyromegaly present.  Cardiovascular: Normal rate and regular rhythm.   No murmur heard. Pulmonary/Chest: Effort normal and breath sounds normal. No respiratory distress. He has no wheezes. She has no rales. She exhibits no tenderness.  Abdominal: Soft. Bowel sounds are normal. He exhibits no distension and no mass. There is no tenderness. There is no rebound and no guarding.  Musculoskeletal: She exhibits no edema.  Lymphadenopathy:    She has no cervical adenopathy.   Neurological: She is alert and oriented to person, place, and time.  She exhibits normal muscle tone. Coordination normal.  Skin: Skin is warm and dry.  Psychiatric: She has a normal mood and affect. Her behavior is normal. Judgment and thought content normal.  Breasts: Examined lying Right: Without masses, retractions, discharge or axillary adenopathy.  Left: Without masses, retractions, discharge or axillary adenopathy.  Inguinal/mons: Normal without inguinal adenopathy  External genitalia: Normal  BUS/Urethra/Skene's glands: Normal  Bladder: Normal  Vagina: Normal  Cervix: Normal  Uterus: normal in size, shape and contour. Midline and mobile  Adnexa/parametria:  Rt: Without masses or tenderness.  Lt: Without masses or tenderness.  Anus and perineum: Normal           Assessment & Plan:         Assessment & Plan:

## 2013-01-26 NOTE — Patient Instructions (Signed)
Please complete lab work prior to leaving. Follow up in 3 months.  

## 2013-01-26 NOTE — Assessment & Plan Note (Signed)
We discussed healthy diet, exercise.  Pap performed today. Obtain fasting labs. Flu shot and pneumovax today EKG today.

## 2013-01-26 NOTE — Assessment & Plan Note (Signed)
Check A1C, Discussed healthy diet, exercise.

## 2013-01-27 LAB — MICROALBUMIN / CREATININE URINE RATIO
Creatinine, Urine: 137.5 mg/dL
Microalb Creat Ratio: 14.6 mg/g (ref 0.0–30.0)
Microalb, Ur: 2.01 mg/dL — ABNORMAL HIGH (ref 0.00–1.89)

## 2013-01-27 LAB — URINALYSIS, ROUTINE W REFLEX MICROSCOPIC
Glucose, UA: NEGATIVE mg/dL
Hgb urine dipstick: NEGATIVE
Leukocytes, UA: NEGATIVE
Nitrite: NEGATIVE
Protein, ur: NEGATIVE mg/dL
Specific Gravity, Urine: 1.017 (ref 1.005–1.030)
pH: 6 (ref 5.0–8.0)

## 2013-01-27 LAB — URINALYSIS, MICROSCOPIC ONLY
Bacteria, UA: NONE SEEN
Casts: NONE SEEN
Squamous Epithelial / LPF: NONE SEEN

## 2013-02-05 ENCOUNTER — Telehealth: Payer: Self-pay | Admitting: Family

## 2013-02-05 ENCOUNTER — Encounter: Payer: Self-pay | Admitting: Family

## 2013-02-05 DIAGNOSIS — R87610 Atypical squamous cells of undetermined significance on cytologic smear of cervix (ASC-US): Secondary | ICD-10-CM

## 2013-02-05 DIAGNOSIS — IMO0001 Reserved for inherently not codable concepts without codable children: Secondary | ICD-10-CM

## 2013-02-05 HISTORY — DX: Atypical squamous cells of undetermined significance on cytologic smear of cervix (ASC-US): R87.610

## 2013-02-05 MED ORDER — LISINOPRIL 2.5 MG PO TABS: 2.5000 mg | ORAL_TABLET | Freq: Every day | ORAL | Status: DC

## 2013-02-05 MED ORDER — ATORVASTATIN CALCIUM 40 MG PO TABS: 40.0000 mg | ORAL_TABLET | Freq: Every day | ORAL | Status: DC

## 2013-02-05 MED ORDER — METFORMIN HCL 500 MG PO TABS: 500.0000 mg | ORAL_TABLET | Freq: Two times a day (BID) | ORAL | Status: DC

## 2013-02-05 NOTE — Telephone Encounter (Addendum)
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  I would like for her to see GYN and have placed referral. Sugar is above goal- add metformin 500mg  twice daily Cholesterol above goal- increase lipitor from 20mg  to 40mg  once daily. + protein in urine- add lisinopril 2.5 mg for renal protection- It is important that she not become pregnant while on lipitor/lisinopril as these medications are not considered safe during pregnancy. Follow up in 1 month for nurse visit- BP check and BMET- dx 250.00 ( need to check after starting lisinopril)

## 2013-02-05 NOTE — Telephone Encounter (Signed)
Notified pt and daughter. She voices understanding. Scheduled nurse visit for 03/09/13 at 10am. Entered lab order.

## 2013-03-09 ENCOUNTER — Ambulatory Visit (INDEPENDENT_AMBULATORY_CARE_PROVIDER_SITE_OTHER): Payer: 59 | Admitting: Family

## 2013-03-09 VITALS — BP 120/80 | HR 72 | Temp 98.2°F | Resp 16 | Ht 63.5 in

## 2013-03-09 DIAGNOSIS — Z0000 Encounter for general adult medical examination without abnormal findings: Secondary | ICD-10-CM

## 2013-03-09 DIAGNOSIS — I10 Essential (primary) hypertension: Secondary | ICD-10-CM

## 2013-03-09 LAB — BASIC METABOLIC PANEL
CO2: 26 mEq/L (ref 19–32)
Chloride: 102 mEq/L (ref 96–112)
Glucose, Bld: 156 mg/dL — ABNORMAL HIGH (ref 70–99)
Potassium: 4.2 mEq/L (ref 3.5–5.3)
Sodium: 138 mEq/L (ref 135–145)

## 2013-03-09 NOTE — Progress Notes (Signed)
Pt presented for BP check and BMP.  Pt also asked if she could start hepatitis vaccines.  Per verbal from Provider, will do titers first then determine if need to start vaccination series.

## 2013-03-10 ENCOUNTER — Encounter: Payer: Self-pay | Admitting: Family

## 2013-03-10 ENCOUNTER — Telehealth: Payer: Self-pay | Admitting: Family

## 2013-03-10 LAB — HEPATITIS B SURFACE ANTIGEN: Hepatitis B Surface Ag: NEGATIVE

## 2013-03-10 LAB — HEPATITIS A ANTIBODY, TOTAL: Hep A Total Ab: NONREACTIVE

## 2013-03-10 NOTE — Telephone Encounter (Signed)
Kidney function, electrolytes normal.  It does not appear that she has been vaccinated or has immunity to hepatitis A and B.  Since [place], we can start the twinrix series which covers Hep A and Hep B if she would like.

## 2013-03-12 NOTE — Assessment & Plan Note (Addendum)
BP Readings from Last 3 Encounters:  03/09/13 120/80  01/26/13 112/76  04/20/12 120/80   BP is stable since addition of low dose ace for renal protection. Continue same.  Obtain bmet.

## 2013-03-15 NOTE — Telephone Encounter (Signed)
Left message for pt to return my call.

## 2013-03-16 ENCOUNTER — Other Ambulatory Visit: Payer: Self-pay | Admitting: Family

## 2013-03-16 NOTE — Telephone Encounter (Signed)
Denial sent to pharmacy as refills were sent on 02/05/13, #30 x 3 refills.

## 2013-03-16 NOTE — Telephone Encounter (Signed)
Notified pt and her daughter. Scheduled nurse visit for 03/23/13 at 10:45am to start twinrix series.

## 2013-03-23 ENCOUNTER — Ambulatory Visit (INDEPENDENT_AMBULATORY_CARE_PROVIDER_SITE_OTHER): Payer: 59 | Admitting: Family

## 2013-03-23 DIAGNOSIS — Z23 Encounter for immunization: Secondary | ICD-10-CM

## 2013-04-02 ENCOUNTER — Ambulatory Visit: Payer: 59 | Admitting: Family

## 2013-04-05 ENCOUNTER — Ambulatory Visit (INDEPENDENT_AMBULATORY_CARE_PROVIDER_SITE_OTHER): Payer: 59 | Admitting: Family

## 2013-04-05 ENCOUNTER — Encounter: Payer: Self-pay | Admitting: Family

## 2013-04-05 VITALS — BP 126/86 | HR 72 | Temp 97.8°F | Resp 16 | Ht 63.5 in | Wt 168.0 lb

## 2013-04-05 DIAGNOSIS — M25519 Pain in unspecified shoulder: Secondary | ICD-10-CM

## 2013-04-05 DIAGNOSIS — M25512 Pain in left shoulder: Secondary | ICD-10-CM

## 2013-04-05 NOTE — Assessment & Plan Note (Signed)
Deteriorated. Refer to sports medicine. Tylenol prn pain.

## 2013-04-05 NOTE — Progress Notes (Signed)
Subjective:    Patient ID: Alexandria Ayers, female    DOB: 1965-09-04, 48 y.o.   MRN: [id number]  HPI  Ms.  Ayers is a 48 yr old female who presents today with a [place] interpretor.  She reports that she has had left shoulder pain x 2 weeks. She injured this shoulder a 3 years ago. Pain is on/off.  She is left handed.  Started playing badminton and this worsened her symptoms. She stopped playing 2 weeks ago. Pain is improving since she stopped playing.    Review of Systems See HPI  Past Medical History  Diagnosis Date  . Abnormal glucose   . Hyperlipidemia   . Hypertension   . History of hemorrhoids   . Lipoma   . Obesity   . Atypical squamous cell changes of undetermined significance (ASCUS) on cervical cytology with positive high risk human papilloma virus (HPV) 02/05/2013    History   Social History  . Marital Status: Married    Spouse Name: N/A    Number of Children: N/A  . Years of Education: N/A   Occupational History  . Not on file.   Social History Main Topics  . Smoking status: Never Smoker   . Smokeless tobacco: Not on file  . Alcohol Use: Not on file  . Drug Use: Not on file  . Sexual Activity: Not on file   Other Topics Concern  . Not on file   Social History Narrative   Occupation: Housewife      Married      Never Smoked      Alcohol use-no      Daughter - Alexandria Ayers (8y/o)    No past surgical history on file.  Family History  Problem Relation Age of Onset  . Hypertension Mother   . Kidney disease Mother     chronic renal insufficiency or some manner of kidney abnormality  . Coronary artery disease      sibling  . Hypothyroidism Mother     s/p thryroid surgery and is hypothyroid  . Other      denies FH of colon cancer,and diabetes    No Known Allergies  Current Outpatient Prescriptions on File Prior to Visit  Medication Sig Dispense Refill  . amLODipine (NORVASC) 5 MG tablet TAKE 1 TABLET (5 MG TOTAL) BY MOUTH DAILY.  30 tablet  5  .  aspirin EC 81 MG tablet Take 81 mg by mouth daily.      Marland Kitchen atorvastatin (LIPITOR) 40 MG tablet Take 1 tablet (40 mg total) by mouth daily.  30 tablet  3  . glucose blood (FREESTYLE LITE) test strip Use to check blood sugar once a day       . levothyroxine (SYNTHROID, LEVOTHROID) 75 MCG tablet Take 1 tablet (75 mcg total) by mouth daily.  30 tablet  5  . lisinopril (PRINIVIL,ZESTRIL) 2.5 MG tablet Take 1 tablet (2.5 mg total) by mouth daily.  30 tablet  3  . metFORMIN (GLUCOPHAGE) 500 MG tablet Take 1 tablet (500 mg total) by mouth 2 (two) times daily with a meal.  60 tablet  2   No current facility-administered medications on file prior to visit.    BP 126/86  Pulse 72  Temp(Src) 97.8 F (36.6 C) (Oral)  Resp 16  Ht 5' 3.5" (1.613 m)  Wt 168 lb 0.6 oz (76.222 kg)  BMI 29.30 kg/m2  SpO2 99%       Objective:   Physical Exam  Constitutional:  She appears well-developed and well-nourished. No distress.  Cardiovascular: Normal rate and regular rhythm.   No murmur heard. Pulmonary/Chest: Effort normal and breath sounds normal. No respiratory distress. She has no wheezes. She has no rales. She exhibits no tenderness.  Musculoskeletal: She exhibits no edema.  L shoulder without tenderness to palpation.  + "clicking" extension.  Neg empty can.  Full ROM of left shoulder is noted.           Assessment & Plan:

## 2013-04-05 NOTE — Patient Instructions (Signed)
You will be contacted about your referral to sports medicine. You may use tylenol as needed for pain.

## 2013-04-21 ENCOUNTER — Ambulatory Visit (INDEPENDENT_AMBULATORY_CARE_PROVIDER_SITE_OTHER): Payer: 59 | Admitting: Family Medicine

## 2013-04-21 ENCOUNTER — Encounter: Payer: Self-pay | Admitting: Family Medicine

## 2013-04-21 VITALS — BP 110/73 | HR 79 | Ht 64.0 in | Wt 166.0 lb

## 2013-04-21 DIAGNOSIS — M25512 Pain in left shoulder: Secondary | ICD-10-CM

## 2013-04-21 DIAGNOSIS — M25519 Pain in unspecified shoulder: Secondary | ICD-10-CM

## 2013-04-21 MED ORDER — DICLOFENAC SODIUM 75 MG PO TBEC: 75.0000 mg | DELAYED_RELEASE_TABLET | Freq: Two times a day (BID) | ORAL | Status: DC

## 2013-04-21 NOTE — Patient Instructions (Signed)
You have rotator cuff impingement Try to avoid painful activities (overhead activities, lifting with extended arm) as much as possible. Voltaren 75 mg twice a day with food for pain and inflammation. Can take tylenol in addition to this. Subacromial injection may be beneficial to help with pain and to decrease inflammation if not improving with therapy. Start physical therapy with transition to home exercise program. Do home exercise program with theraband and scapular stabilization exercises daily - these are very important for long term relief even if an injection was given. Consider nitroglycerin patches if not improving also. If not improving at follow-up we will consider further imaging (MRI), physical therapy and/or nitro patches. Follow up with me in 6 weeks.

## 2013-04-25 ENCOUNTER — Encounter: Payer: Self-pay | Admitting: Family Medicine

## 2013-04-25 NOTE — Assessment & Plan Note (Signed)
2/2 rotator cuff impingement - classic presentation.  Discussed options - would like to start with nsaids plus physical therapy.  HEP as well.  Consider injection, nitro patches if not improving.  She wanted MRI or x-ray to 'find out what is wrong' - explained to her x-rays only show bones and would not change management.  MRI unnecessary as she hasn't had an acute injury, has classic impingement, and treatment also would not change as no evidence complete rotator cuff tear or fracture.  F/u in 6 weeks.

## 2013-04-25 NOTE — Progress Notes (Signed)
Patient ID: Alexandria Ayers, female   DOB: September 08, 1965, 48 y.o.   MRN: [id number]  PCP: Nance Pear., NP  Subjective:   HPI: Patient is a 48 y.o. female here for left shoulder pain.  Patient denies known injury recently About 3-4 years ago she did have a fall where injured left shoulder but improved from this. Then recently she was playing badminton (does so 2-3 times a week for 2 hours at a time) and started getting pain with overhead motions. + night pain. Worse lying on left shoulder. Not tied anything for this.  Past Medical History  Diagnosis Date  . Abnormal glucose   . Hyperlipidemia   . Hypertension   . History of hemorrhoids   . Lipoma   . Obesity   . Atypical squamous cell changes of undetermined significance (ASCUS) on cervical cytology with positive high risk human papilloma virus (HPV) 02/05/2013    Current Outpatient Prescriptions on File Prior to Visit  Medication Sig Dispense Refill  . amLODipine (NORVASC) 5 MG tablet TAKE 1 TABLET (5 MG TOTAL) BY MOUTH DAILY.  30 tablet  5  . aspirin EC 81 MG tablet Take 81 mg by mouth daily.      Marland Kitchen atorvastatin (LIPITOR) 40 MG tablet Take 1 tablet (40 mg total) by mouth daily.  30 tablet  3  . glucose blood (FREESTYLE LITE) test strip Use to check blood sugar once a day       . levothyroxine (SYNTHROID, LEVOTHROID) 75 MCG tablet Take 1 tablet (75 mcg total) by mouth daily.  30 tablet  5  . lisinopril (PRINIVIL,ZESTRIL) 2.5 MG tablet Take 1 tablet (2.5 mg total) by mouth daily.  30 tablet  3  . metFORMIN (GLUCOPHAGE) 500 MG tablet Take 1 tablet (500 mg total) by mouth 2 (two) times daily with a meal.  60 tablet  2   No current facility-administered medications on file prior to visit.    History reviewed. No pertinent past surgical history.  No Known Allergies  History   Social History  . Marital Status: Married    Spouse Name: N/A    Number of Children: N/A  . Years of Education: N/A   Occupational History  .  Not on file.   Social History Main Topics  . Smoking status: Never Smoker   . Smokeless tobacco: Not on file  . Alcohol Use: Not on file  . Drug Use: Not on file  . Sexual Activity: Not on file   Other Topics Concern  . Not on file   Social History Narrative   Occupation: Housewife      Married      Never Smoked      Alcohol use-no      Daughter - Marlowe Kays (8y/o)    Family History  Problem Relation Age of Onset  . Hypertension Mother   . Kidney disease Mother     chronic renal insufficiency or some manner of kidney abnormality  . Hypothyroidism Mother     s/p thryroid surgery and is hypothyroid  . Coronary artery disease      sibling  . Other      denies FH of colon cancer,and diabetes  . Hypertension Father   . Hyperlipidemia Father     BP 110/73  Pulse 79  Ht 5\' 4"  (1.626 m)  Wt 166 lb (75.297 kg)  BMI 28.48 kg/m2  Review of Systems: See HPI above.    Objective:  Physical Exam:  Gen: NAD  Left  shoulder: No swelling, ecchymoses.  No gross deformity. No TTP. FROM with painful arc. Positive Hawkins, Neers. Negative Speeds, Yergasons. Strength 5-/5 with empty can, painful.  5/5 with resisted internal/external rotation. Negative apprehension. NV intact distally.    Assessment & Plan:  1. Left shoulder pain - 2/2 rotator cuff impingement - classic presentation.  Discussed options - would like to start with nsaids plus physical therapy.  HEP as well.  Consider injection, nitro patches if not improving.  She wanted MRI or x-ray to 'find out what is wrong' - explained to her x-rays only show bones and would not change management.  MRI unnecessary as she hasn't had an acute injury, has classic impingement, and treatment also would not change as no evidence complete rotator cuff tear or fracture.  F/u in 6 weeks.

## 2013-05-05 ENCOUNTER — Ambulatory Visit: Payer: 59 | Admitting: Family

## 2013-05-10 ENCOUNTER — Encounter: Payer: Self-pay | Admitting: Family

## 2013-05-10 ENCOUNTER — Ambulatory Visit (INDEPENDENT_AMBULATORY_CARE_PROVIDER_SITE_OTHER): Payer: 59 | Admitting: Family

## 2013-05-10 ENCOUNTER — Telehealth: Payer: Self-pay | Admitting: *Deleted

## 2013-05-10 VITALS — BP 120/80 | HR 70 | Temp 97.8°F | Resp 16 | Ht 63.5 in | Wt 169.0 lb

## 2013-05-10 DIAGNOSIS — IMO0001 Reserved for inherently not codable concepts without codable children: Secondary | ICD-10-CM

## 2013-05-10 DIAGNOSIS — R739 Hyperglycemia, unspecified: Secondary | ICD-10-CM

## 2013-05-10 DIAGNOSIS — E039 Hypothyroidism, unspecified: Secondary | ICD-10-CM

## 2013-05-10 DIAGNOSIS — R7309 Other abnormal glucose: Secondary | ICD-10-CM

## 2013-05-10 DIAGNOSIS — E119 Type 2 diabetes mellitus without complications: Secondary | ICD-10-CM

## 2013-05-10 DIAGNOSIS — I10 Essential (primary) hypertension: Secondary | ICD-10-CM

## 2013-05-10 DIAGNOSIS — Z23 Encounter for immunization: Secondary | ICD-10-CM

## 2013-05-10 DIAGNOSIS — R809 Proteinuria, unspecified: Secondary | ICD-10-CM

## 2013-05-10 LAB — HEMOGLOBIN A1C
Hgb A1c MFr Bld: 6.8 % — ABNORMAL HIGH (ref <5.7)
Mean Plasma Glucose: 148 mg/dL — ABNORMAL HIGH (ref <117)

## 2013-05-10 LAB — BASIC METABOLIC PANEL
BUN: 9 mg/dL (ref 6–23)
CHLORIDE: 101 meq/L (ref 96–112)
CO2: 28 meq/L (ref 19–32)
Calcium: 9.8 mg/dL (ref 8.4–10.5)
Creat: 0.5 mg/dL (ref 0.50–1.10)
GLUCOSE: 109 mg/dL — AB (ref 70–99)
Potassium: 4.1 mEq/L (ref 3.5–5.3)
Sodium: 136 mEq/L (ref 135–145)

## 2013-05-10 NOTE — Telephone Encounter (Signed)
Health Maintenance  updated

## 2013-05-10 NOTE — Telephone Encounter (Signed)
Message copied by Ronny Flurry on Mon May 10, 2013  4:58 PM ------      Message from: O'SULLIVAN, MELISSA      Created: Mon May 10, 2013  4:15 PM       Had normal eye exam per pt 7/14 ------

## 2013-05-10 NOTE — Patient Instructions (Signed)
Please complete lab work prior to leaving. Follow up in 3 months.  

## 2013-05-10 NOTE — Progress Notes (Signed)
Pre visit review using our clinic review tool, if applicable. No additional management support is needed unless otherwise documented below in the visit note. 

## 2013-05-10 NOTE — Assessment & Plan Note (Signed)
Continue synthroid, clinically stable.

## 2013-05-10 NOTE — Assessment & Plan Note (Signed)
BP stable on current meds, continue same,obtain bmet.  

## 2013-05-10 NOTE — Assessment & Plan Note (Signed)
Continue metformin, obtain follow up A1C.

## 2013-05-10 NOTE — Progress Notes (Signed)
Subjective:    Patient ID: Alexandria Ayers, female    DOB: January 13, 1966, 48 y.o.   MRN: [id number]  HPI  Alexandria Ayers is a 48 yr old female who presents today for follow up.  1) DM2-  Due for follow up a1C.  Reports fasting sugar 142, yesterday 124.  Last eye exam: reports last eye exam was <1 yr ago.   Current meds include metformin. Lab Results  Component Value Date   HGBA1C 7.1* 01/26/2013     2) HTN- BP meds include amlodipine, lisinopril.  Denies CP/SOB or swelling. BP Readings from Last 3 Encounters:  05/10/13 120/80  04/21/13 110/73  04/05/13 126/86    3) Hypothyroid- currently maintained on synthroid, feels well on this dose.  Lab Results  Component Value Date   TSH 2.779 01/26/2013    4) Immunizations- due for second twinrix today.     Review of Systems    see HPI  Past Medical History  Diagnosis Date  . Abnormal glucose   . Hyperlipidemia   . Hypertension   . History of hemorrhoids   . Lipoma   . Obesity   . Atypical squamous cell changes of undetermined significance (ASCUS) on cervical cytology with positive high risk human papilloma virus (HPV) 02/05/2013    History   Social History  . Marital Status: Married    Spouse Name: N/A    Number of Children: N/A  . Years of Education: N/A   Occupational History  . Not on file.   Social History Main Topics  . Smoking status: Never Smoker   . Smokeless tobacco: Not on file  . Alcohol Use: Not on file  . Drug Use: Not on file  . Sexual Activity: Not on file   Other Topics Concern  . Not on file   Social History Narrative   Occupation: Housewife      Married      Never Smoked      Alcohol use-no      Daughter - Alexandria Ayers (8y/o)    No past surgical history on file.  Family History  Problem Relation Age of Onset  . Hypertension Mother   . Kidney disease Mother     chronic renal insufficiency or some manner of kidney abnormality  . Hypothyroidism Mother     s/p thryroid surgery and is  hypothyroid  . Coronary artery disease      sibling  . Other      denies FH of colon cancer,and diabetes  . Hypertension Father   . Hyperlipidemia Father     No Known Allergies  Current Outpatient Prescriptions on File Prior to Visit  Medication Sig Dispense Refill  . amLODipine (NORVASC) 5 MG tablet TAKE 1 TABLET (5 MG TOTAL) BY MOUTH DAILY.  30 tablet  5  . aspirin EC 81 MG tablet Take 81 mg by mouth daily.      Marland Kitchen atorvastatin (LIPITOR) 40 MG tablet Take 1 tablet (40 mg total) by mouth daily.  30 tablet  3  . diclofenac (VOLTAREN) 75 MG EC tablet Take 1 tablet (75 mg total) by mouth 2 (two) times daily.  60 tablet  1  . glucose blood (FREESTYLE LITE) test strip Use to check blood sugar once a day       . levothyroxine (SYNTHROID, LEVOTHROID) 75 MCG tablet Take 1 tablet (75 mcg total) by mouth daily.  30 tablet  5  . lisinopril (PRINIVIL,ZESTRIL) 2.5 MG tablet Take 1 tablet (2.5 mg total)  by mouth daily.  30 tablet  3  . metFORMIN (GLUCOPHAGE) 500 MG tablet Take 1 tablet (500 mg total) by mouth 2 (two) times daily with a meal.  60 tablet  2   No current facility-administered medications on file prior to visit.    BP 120/80  Pulse 70  Temp(Src) 97.8 F (36.6 C) (Oral)  Resp 16  Ht 5' 3.5" (1.613 m)  Wt 169 lb (76.658 kg)  BMI 29.46 kg/m2  SpO2 99%    Objective:   Physical Exam  Constitutional: She is oriented to person, place, and time. She appears well-developed and well-nourished. No distress.  HENT:  Head: Normocephalic and atraumatic.  Cardiovascular: Normal rate and regular rhythm.   No murmur heard. Pulmonary/Chest: Effort normal and breath sounds normal. No respiratory distress. She has no wheezes. She has no rales. She exhibits no tenderness.  Neurological: She is alert and oriented to person, place, and time.  Psychiatric: She has a normal mood and affect. Her behavior is normal. Judgment and thought content normal.          Assessment & Plan:

## 2013-05-10 NOTE — Addendum Note (Signed)
Addended by: Ronny Flurry on: 05/10/2013 06:58 PM   Modules accepted: Orders

## 2013-05-11 ENCOUNTER — Encounter: Payer: Self-pay | Admitting: Family

## 2013-05-11 ENCOUNTER — Telehealth: Payer: Self-pay | Admitting: Family

## 2013-05-11 NOTE — Telephone Encounter (Signed)
Relevant patient education assigned to patient using Emmi. ° °

## 2013-06-02 ENCOUNTER — Ambulatory Visit: Payer: 59 | Admitting: Family Medicine

## 2013-06-23 ENCOUNTER — Other Ambulatory Visit: Payer: Self-pay | Admitting: Family

## 2013-06-24 NOTE — Telephone Encounter (Signed)
Rx request to pharmacy/SLS  

## 2013-07-01 ENCOUNTER — Other Ambulatory Visit: Payer: Self-pay | Admitting: Family

## 2013-07-02 ENCOUNTER — Other Ambulatory Visit: Payer: Self-pay | Admitting: Family

## 2013-07-02 NOTE — Telephone Encounter (Signed)
Rx request to pharmacy/SLS  

## 2013-07-20 ENCOUNTER — Other Ambulatory Visit: Payer: Self-pay | Admitting: Family

## 2013-09-08 ENCOUNTER — Other Ambulatory Visit: Payer: Self-pay | Admitting: Family

## 2013-10-05 ENCOUNTER — Encounter: Payer: Self-pay | Admitting: Family

## 2013-10-05 ENCOUNTER — Ambulatory Visit (INDEPENDENT_AMBULATORY_CARE_PROVIDER_SITE_OTHER): Payer: 59 | Admitting: Family

## 2013-10-05 VITALS — BP 100/80 | HR 69 | Temp 98.0°F | Resp 16 | Ht 63.5 in | Wt 166.0 lb

## 2013-10-05 DIAGNOSIS — E785 Hyperlipidemia, unspecified: Secondary | ICD-10-CM

## 2013-10-05 DIAGNOSIS — E119 Type 2 diabetes mellitus without complications: Secondary | ICD-10-CM

## 2013-10-05 DIAGNOSIS — Z23 Encounter for immunization: Secondary | ICD-10-CM

## 2013-10-05 DIAGNOSIS — IMO0001 Reserved for inherently not codable concepts without codable children: Secondary | ICD-10-CM

## 2013-10-05 DIAGNOSIS — I10 Essential (primary) hypertension: Secondary | ICD-10-CM

## 2013-10-05 DIAGNOSIS — R809 Proteinuria, unspecified: Secondary | ICD-10-CM

## 2013-10-05 DIAGNOSIS — E039 Hypothyroidism, unspecified: Secondary | ICD-10-CM

## 2013-10-05 LAB — HEPATIC FUNCTION PANEL
ALBUMIN: 4.5 g/dL (ref 3.5–5.2)
ALT: 31 U/L (ref 0–35)
AST: 16 U/L (ref 0–37)
Alkaline Phosphatase: 58 U/L (ref 39–117)
Bilirubin, Direct: 0.1 mg/dL (ref 0.0–0.3)
Indirect Bilirubin: 0.4 mg/dL (ref 0.2–1.2)
Total Bilirubin: 0.5 mg/dL (ref 0.2–1.2)
Total Protein: 7.4 g/dL (ref 6.0–8.3)

## 2013-10-05 LAB — HEMOGLOBIN A1C
Hgb A1c MFr Bld: 7.4 % — ABNORMAL HIGH (ref <5.7)
Mean Plasma Glucose: 166 mg/dL — ABNORMAL HIGH (ref <117)

## 2013-10-05 LAB — BASIC METABOLIC PANEL WITH GFR
BUN: 13 mg/dL (ref 6–23)
CALCIUM: 9.3 mg/dL (ref 8.4–10.5)
CO2: 26 meq/L (ref 19–32)
CREATININE: 0.49 mg/dL — AB (ref 0.50–1.10)
Chloride: 101 mEq/L (ref 96–112)
GFR, Est Non African American: >89 mL/min
Glucose, Bld: 117 mg/dL — ABNORMAL HIGH (ref 70–99)
Potassium: 4.3 mEq/L (ref 3.5–5.3)
Sodium: 137 mEq/L (ref 135–145)

## 2013-10-05 LAB — LIPID PANEL
Cholesterol: 145 mg/dL (ref 0–200)
HDL: 32 mg/dL — AB (ref >39)
LDL Cholesterol: 71 mg/dL (ref 0–99)
Total CHOL/HDL Ratio: 4.5 Ratio
Triglycerides: 210 mg/dL — ABNORMAL HIGH (ref <150)
VLDL: 42 mg/dL — AB (ref 0–40)

## 2013-10-05 MED ORDER — ATORVASTATIN CALCIUM 40 MG PO TABS: ORAL_TABLET | ORAL | Status: DC

## 2013-10-05 MED ORDER — LISINOPRIL 2.5 MG PO TABS: ORAL_TABLET | ORAL | Status: DC

## 2013-10-05 MED ORDER — OMEPRAZOLE 40 MG PO CPDR: 40.0000 mg | DELAYED_RELEASE_CAPSULE | Freq: Every day | ORAL | Status: DC

## 2013-10-05 MED ORDER — METFORMIN HCL 500 MG PO TABS: 500.0000 mg | ORAL_TABLET | Freq: Two times a day (BID) | ORAL | Status: DC

## 2013-10-05 MED ORDER — AMLODIPINE BESYLATE 5 MG PO TABS: ORAL_TABLET | ORAL | Status: DC

## 2013-10-05 NOTE — Progress Notes (Signed)
Pre visit review using our clinic review tool, if applicable. No additional management support is needed unless otherwise documented below in the visit note. 

## 2013-10-05 NOTE — Progress Notes (Signed)
Subjective:    Patient ID: Alexandria Ayers, female    DOB: 05/29/1965, 48 y.o.   MRN: [id number]  HPI  Alexandria Ayers is a 48 yr old female who presents today for follow up of multiple medical problems. She is accompanied by a [name].  1) HTN- She is maintained on amlodipine and lisinopril.   BP Readings from Last 3 Encounters:  10/05/13 100/80  05/10/13 120/80  04/21/13 110/73   Pt reports BP 140/80 last week when she checked at the pharmacy.  Denies dizziness.  Denies CP/SOB or swelling.  2) DM2-  Maintained on metformin.  Reports 115-130 generally.  Higher if she is not exercising. Taking metformin once daily Lab Results  Component Value Date   HGBA1C 6.8* 05/10/2013   3) Hyperlipidemia- Denies myalgia. Reports + compliance. Lab Results  Component Value Date   LDLCALC 104* 01/26/2013   4) Hypothyroid- Continues synthroid. Feels tired all the time. Wakes up feeling rested.  Wt Readings from Last 3 Encounters:  10/05/13 166 lb (75.297 kg)  05/10/13 169 lb (76.658 kg)  04/21/13 166 lb (75.297 kg)   Cough- pt reports that she diagnosed with esophagitis in [place] by endoscopy 6/14. Was prescribed medication and symptoms improved.  In April she travelled to [place] and had a URI with wheeze/coughing.  Cough continued after she returned. Improved with cough syrup.  Reports that recently when talking she develops dry cough.  Review of Systems See HPI  Past Medical History  Diagnosis Date  . Abnormal glucose   . Hyperlipidemia   . Hypertension   . History of hemorrhoids   . Lipoma   . Obesity   . Atypical squamous cell changes of undetermined significance (ASCUS) on cervical cytology with positive high risk human papilloma virus (HPV) 02/05/2013    History   Social History  . Marital Status: Married    Spouse Name: N/A    Number of Children: N/A  . Years of Education: N/A   Occupational History  . Not on file.   Social History Main Topics  . Smoking status: Never  Smoker   . Smokeless tobacco: Not on file  . Alcohol Use: Not on file  . Drug Use: Not on file  . Sexual Activity: Not on file   Other Topics Concern  . Not on file   Social History Narrative   Occupation: Housewife      Married      Never Smoked      Alcohol use-no      Daughter - Marlowe Kays (8y/o)    No past surgical history on file.  Family History  Problem Relation Age of Onset  . Hypertension Mother   . Kidney disease Mother     chronic renal insufficiency or some manner of kidney abnormality  . Hypothyroidism Mother     s/p thryroid surgery and is hypothyroid  . Coronary artery disease      sibling  . Other      denies FH of colon cancer,and diabetes  . Hypertension Father   . Hyperlipidemia Father     No Known Allergies  Current Outpatient Prescriptions on File Prior to Visit  Medication Sig Dispense Refill  . amLODipine (NORVASC) 5 MG tablet TAKE 1 TABLET (5 MG TOTAL) BY MOUTH DAILY.  30 tablet  0  . atorvastatin (LIPITOR) 40 MG tablet TAKE 1 TABLET (40 MG TOTAL) BY MOUTH DAILY.  30 tablet  3  . glucose blood (FREESTYLE LITE) test strip Use  to check blood sugar once a day       . levothyroxine (SYNTHROID, LEVOTHROID) 75 MCG tablet TAKE 1 TABLET (75 MCG TOTAL) BY MOUTH DAILY.  30 tablet  5  . lisinopril (PRINIVIL,ZESTRIL) 2.5 MG tablet TAKE 1 TABLET (2.5 MG TOTAL) BY MOUTH DAILY.  30 tablet  3  . metFORMIN (GLUCOPHAGE) 500 MG tablet Take 1 tablet (500 mg total) by mouth 2 (two) times daily with a meal.  60 tablet  2   No current facility-administered medications on file prior to visit.    BP 100/80  Pulse 69  Temp(Src) 98 F (36.7 C) (Oral)  Resp 16  Ht 5' 3.5" (1.613 m)  Wt 166 lb (75.297 kg)  BMI 28.94 kg/m2  SpO2 97%       Objective:   Physical Exam  Constitutional: She is oriented to person, place, and time. She appears well-developed and well-nourished. No distress.  Cardiovascular: Normal rate and regular rhythm.   No murmur  heard. Pulmonary/Chest: Effort normal and breath sounds normal. No respiratory distress. She has no wheezes. She has no rales. She exhibits no tenderness.  Musculoskeletal: She exhibits no edema.  Neurological: She is alert and oriented to person, place, and time.  Psychiatric: She has a normal mood and affect. Her behavior is normal. Judgment and thought content normal.          Assessment & Plan:

## 2013-10-05 NOTE — Patient Instructions (Addendum)
Please complete lab work prior to leaving.   Start omeprazole for acid and cough. Please schedule a follow up appointment in 1 month. Schedule annual eye exam.

## 2013-10-06 ENCOUNTER — Encounter: Payer: Self-pay | Admitting: Family

## 2013-10-06 LAB — TSH: TSH: 1.848 u[IU]/mL (ref 0.350–4.500)

## 2013-10-07 NOTE — Assessment & Plan Note (Addendum)
Maintained on metformin. A1C is above goal. She is only taking metformin once daily.  Needs to start taking bid.   Lab Results  Component Value Date   HGBA1C 7.4* 10/05/2013

## 2013-10-07 NOTE — Assessment & Plan Note (Signed)
Lab Results  Component Value Date   TSH 1.848 10/05/2013    TSH stable on current dose of synthroid, continue same.

## 2013-10-07 NOTE — Assessment & Plan Note (Signed)
Lab Results  Component Value Date   TSH 1.848 10/05/2013

## 2013-10-07 NOTE — Assessment & Plan Note (Signed)
BP stable on current meds. Continue same. Obtain bmet 

## 2013-11-09 ENCOUNTER — Ambulatory Visit: Payer: 59 | Admitting: Family

## 2013-11-10 ENCOUNTER — Ambulatory Visit: Payer: 59 | Admitting: Family

## 2014-01-12 DIAGNOSIS — Z23 Encounter for immunization: Secondary | ICD-10-CM

## 2014-01-12 NOTE — Addendum Note (Signed)
Addended by: Kelle Darting A on: 01/12/2014 04:20 PM   Modules accepted: Orders

## 2014-01-31 ENCOUNTER — Other Ambulatory Visit: Payer: Self-pay | Admitting: Family

## 2014-01-31 NOTE — Telephone Encounter (Signed)
Refills sent for amlodipine, metformin and omeprazole. Pt is past due for follow up and will need to be seen before further refills can be given.

## 2014-02-28 ENCOUNTER — Other Ambulatory Visit: Payer: Self-pay | Admitting: Family

## 2014-02-28 NOTE — Telephone Encounter (Signed)
Rx request to pharmacy/SLS  

## 2014-03-08 ENCOUNTER — Other Ambulatory Visit: Payer: Self-pay | Admitting: Family

## 2014-03-08 NOTE — Telephone Encounter (Signed)
30 day supply lisinopril and atorvastatin sent to pharmacy. Pt last seen in 09/2013 and advised 1 month f/u.  Pt is past due and will need to be seen before further refills can be given. Please call pt to arrange appt soon.

## 2014-03-22 ENCOUNTER — Other Ambulatory Visit: Payer: Self-pay | Admitting: Family

## 2014-03-22 NOTE — Telephone Encounter (Signed)
Medication Detail      Disp Refills Start End     levothyroxine (SYNTHROID, LEVOTHROID) 75 MCG tablet 30 tablet 5 07/20/2013     Sig: TAKE 1 TABLET (75 MCG TOTAL) BY MOUTH DAILY.    E-Prescribing Status: Receipt confirmed by pharmacy (07/20/2013 11:00 AM EDT)      Instructions    AVS: 07.21.15  Please complete lab work prior to leaving.  Start omeprazole for acid and cough. Please schedule a follow up appointment in 1 month. [Canceled 08.25.15 & 08.26.15 appt] Schedule annual eye exam.  Two week supply only, pt instructed 12.22.15 refills that appointment was needed for future refills/SLS

## 2014-04-03 ENCOUNTER — Other Ambulatory Visit: Payer: Self-pay | Admitting: Family

## 2014-04-04 NOTE — Telephone Encounter (Signed)
Patient notified via voicemail that she needs to schedule follow-up appointment.  Letter mailed.  Previous Rx stated that patient was not to get further refills until labs were obtained.  eal

## 2014-04-19 ENCOUNTER — Ambulatory Visit (INDEPENDENT_AMBULATORY_CARE_PROVIDER_SITE_OTHER): Payer: 59 | Admitting: Family

## 2014-04-19 ENCOUNTER — Encounter: Payer: Self-pay | Admitting: Family

## 2014-04-19 ENCOUNTER — Telehealth: Payer: Self-pay | Admitting: *Deleted

## 2014-04-19 VITALS — BP 124/80 | HR 77 | Temp 98.3°F | Resp 16 | Ht 63.5 in | Wt 169.1 lb

## 2014-04-19 DIAGNOSIS — Z0000 Encounter for general adult medical examination without abnormal findings: Secondary | ICD-10-CM

## 2014-04-19 DIAGNOSIS — I10 Essential (primary) hypertension: Secondary | ICD-10-CM

## 2014-04-19 DIAGNOSIS — E119 Type 2 diabetes mellitus without complications: Secondary | ICD-10-CM

## 2014-04-19 DIAGNOSIS — IMO0002 Reserved for concepts with insufficient information to code with codable children: Secondary | ICD-10-CM

## 2014-04-19 DIAGNOSIS — E1165 Type 2 diabetes mellitus with hyperglycemia: Secondary | ICD-10-CM

## 2014-04-19 DIAGNOSIS — R809 Proteinuria, unspecified: Secondary | ICD-10-CM

## 2014-04-19 DIAGNOSIS — E785 Hyperlipidemia, unspecified: Secondary | ICD-10-CM

## 2014-04-19 DIAGNOSIS — IMO0001 Reserved for inherently not codable concepts without codable children: Secondary | ICD-10-CM

## 2014-04-19 DIAGNOSIS — E039 Hypothyroidism, unspecified: Secondary | ICD-10-CM

## 2014-04-19 DIAGNOSIS — E041 Nontoxic single thyroid nodule: Secondary | ICD-10-CM

## 2014-04-19 MED ORDER — OMEPRAZOLE 40 MG PO CPDR: DELAYED_RELEASE_CAPSULE | ORAL | Status: DC

## 2014-04-19 MED ORDER — ATORVASTATIN CALCIUM 40 MG PO TABS: ORAL_TABLET | ORAL | Status: DC

## 2014-04-19 MED ORDER — LISINOPRIL 2.5 MG PO TABS: ORAL_TABLET | ORAL | Status: DC

## 2014-04-19 MED ORDER — AMLODIPINE BESYLATE 5 MG PO TABS: ORAL_TABLET | ORAL | Status: DC

## 2014-04-19 MED ORDER — METFORMIN HCL 500 MG PO TABS: ORAL_TABLET | ORAL | Status: DC

## 2014-04-19 MED ORDER — LEVOTHYROXINE SODIUM 75 MCG PO TABS: 75.0000 ug | ORAL_TABLET | Freq: Every day | ORAL | Status: DC

## 2014-04-19 NOTE — Telephone Encounter (Signed)
Per verbal from Provider pt is requesting 90 day supply of all meds be sent to pharmacy and to cancel previous Rxs for 30 days. Cancelled Rxs with John at [facility] and re-sent rxs with #90 x 1 rf each.

## 2014-04-19 NOTE — Assessment & Plan Note (Signed)
Schedule [place] to compare with prior US in 2013. If no change, may consider discontinuing serial [place].

## 2014-04-19 NOTE — Assessment & Plan Note (Addendum)
Reports occasional dizziness possibly related to low BP.  Instructed to monitor BP at home and call with readings. Check BMET today.

## 2014-04-19 NOTE — Assessment & Plan Note (Addendum)
Uncontrolled at last check in 09/2013. Pt prefers to take only one pill daily. Check A1c and microalbumin today. Based on results of A1c, may change to metformin ER.

## 2014-04-19 NOTE — Assessment & Plan Note (Signed)
TSH today 

## 2014-04-19 NOTE — Patient Instructions (Addendum)
Please complete lab work prior to leaving. Check blood pressure at home once daily for 1 week and call me with your results in 1 week. You will be contacted about scheduling your thyroid ultrasound and mammogram.  Schedule physical at the front desk.

## 2014-04-19 NOTE — Progress Notes (Signed)
   Subjective:    Patient ID: Alexandria Ayers, female    DOB: October 26, 1965, 49 y.o.   MRN: [id number]  HPI  Alexandria Ayers is here today for follow up of multiple medical problems. She has an interpreter with her.  1. DM: Fasting BS: today was 150. Doesn't check often. Exercise: badminton, yoga. Twice weekly. Eye exam: 3 years ago Reports compliance with metformin. Denies episodes of hypoglycemia. Lab Results  Component Value Date   HGBA1C 7.4* 10/05/2013   HGBA1C 6.8* 05/10/2013   HGBA1C 7.1* 01/26/2013   Lab Results  Component Value Date   MICROALBUR 2.01* 01/26/2013   LDLCALC 71 10/05/2013   CREATININE 0.49* 10/05/2013   2. HYPERTENSION: Maintained on  lisinopril and amlodipine.Has not taken meds for two weeks but took today. Denies chest pain, dyspnea, blurred vision, headache, or lower extremity edema. Reports dizziness at times and worries that her BP may be too low.  BP Readings from Last 3 Encounters:  04/19/14 124/80  10/05/13 100/80  05/10/13 120/80    3. Hyperlipidemia: Reports compliance with atorvastatin. Denies myalgia. Lab Results  Component Value Date   CHOL 145 10/05/2013   HDL 32* 10/05/2013   LDLCALC 71 10/05/2013   LDLDIRECT 138.8 09/16/2007   TRIG 210* 10/05/2013   CHOLHDL 4.5 10/05/2013   4. Hypothyroidism: Reports compliance with levothyroxine 75 mcg. Lab Results  Component Value Date   TSH 1.848 10/05/2013   Review of Systems     Objective:   Physical Exam  Constitutional: She is oriented to person, place, and time. She appears well-developed and well-nourished. No distress.  Neck: No thyromegaly present.  Cardiovascular: Normal rate, regular rhythm, normal heart sounds and intact distal pulses.  Exam reveals no gallop and no friction rub.   No murmur heard. Pulmonary/Chest: Effort normal and breath sounds normal. No respiratory distress. She has no wheezes. She has no rales.  Musculoskeletal:  No lower extremity edema.  Neurological: She  is alert and oriented to person, place, and time.  Skin: Skin is warm and dry. She is not diaphoretic.  Psychiatric: She has a normal mood and affect. Her behavior is normal. Judgment and thought content normal.          Assessment & Plan:  Patient seen along with [facility] NP-student.  I have personally seen and examined patient and agree with Alexandria Ayers's assessment and plan- a [place] interpreter was present at today's visit. Debbrah Alar NP

## 2014-04-19 NOTE — Progress Notes (Signed)
Pre visit review using our clinic review tool, if applicable. No additional management support is needed unless otherwise documented below in the visit note. 

## 2014-04-19 NOTE — Assessment & Plan Note (Signed)
Check lipid profile today. 

## 2014-04-20 LAB — LIPID PANEL
Cholesterol: 219 mg/dL — ABNORMAL HIGH (ref 0–200)
HDL: 39.9 mg/dL (ref >39.00)
Total CHOL/HDL Ratio: 5

## 2014-04-20 LAB — BASIC METABOLIC PANEL
BUN: 10 mg/dL (ref 6–23)
CO2: 28 mEq/L (ref 19–32)
Calcium: 9.2 mg/dL (ref 8.4–10.5)
Chloride: 101 mEq/L (ref 96–112)
Creatinine, Ser: 0.54 mg/dL (ref 0.40–1.20)
GFR: 126.77 mL/min (ref >60.00)
GLUCOSE: 203 mg/dL — AB (ref 70–99)
POTASSIUM: 4.1 meq/L (ref 3.5–5.1)
Sodium: 135 mEq/L (ref 135–145)

## 2014-04-20 LAB — MICROALBUMIN / CREATININE URINE RATIO
CREATININE, U: 52.8 mg/dL
MICROALB UR: 1.5 mg/dL (ref 0.0–1.9)
Microalb Creat Ratio: 2.8 mg/g (ref 0.0–30.0)

## 2014-04-20 LAB — TSH: TSH: 5.48 u[IU]/mL — AB (ref 0.35–4.50)

## 2014-04-20 LAB — HEMOGLOBIN A1C: HEMOGLOBIN A1C: 8.8 % — AB (ref 4.6–6.5)

## 2014-04-20 LAB — LDL CHOLESTEROL, DIRECT: LDL DIRECT: 108 mg/dL

## 2014-04-21 ENCOUNTER — Other Ambulatory Visit: Payer: Self-pay | Admitting: Family

## 2014-04-21 NOTE — Telephone Encounter (Signed)
Thyroid med needs to be increased from 75- 149mcg, pended below, repeat tsh in 6 weeks. Triglycerides/cholesterol uncontrolled.  Increase lipitor from 40mg  to 80mg . Your triglycerides are mildly elevated. Please work on avoiding concentrated sweets, and limiting white carbs (rice/bread/pasta/potatoes). Instead substitute whole grain versions with reasonable portions. Repeat FLP in 6 weeks. Sugar uncontrolled, d/c metformin, start janumet xr (download coupon from website for savings).

## 2014-04-22 NOTE — Telephone Encounter (Signed)
Continue previous doses, resume. Please cancel new rx dosing at her pharmacy.

## 2014-04-22 NOTE — Telephone Encounter (Signed)
New Rxs cancelled. Pharmacy never received them. Previous meds re-instated on med list. Notified pt and she voices understanding.

## 2014-04-22 NOTE — Telephone Encounter (Signed)
Called patient using Alexandria Ayers and notified patient of lab results and medication changes.  Patient verbalized understanding.    Patient states that she was not taking her medications before (metformin, lipitor, or synthroid) and would like to know if she can restart those and then have her labs rechecked at her physical (05/25/14) before changing medications?   Please advise.  eal

## 2014-04-26 ENCOUNTER — Ambulatory Visit (HOSPITAL_BASED_OUTPATIENT_CLINIC_OR_DEPARTMENT_OTHER)
Admission: RE | Admit: 2014-04-26 | Discharge: 2014-04-26 | Disposition: A | Discharge status: Home / Self Care | Payer: 59 | Source: Ambulatory Visit | Attending: Family | Admitting: Family

## 2014-04-26 ENCOUNTER — Encounter: Payer: Self-pay | Admitting: Family

## 2014-04-26 DIAGNOSIS — Z0000 Encounter for general adult medical examination without abnormal findings: Secondary | ICD-10-CM

## 2014-04-26 DIAGNOSIS — E041 Nontoxic single thyroid nodule: Secondary | ICD-10-CM | POA: Diagnosis not present

## 2014-05-25 ENCOUNTER — Encounter: Payer: Self-pay | Admitting: Family

## 2014-05-25 ENCOUNTER — Ambulatory Visit (INDEPENDENT_AMBULATORY_CARE_PROVIDER_SITE_OTHER): Payer: 59 | Admitting: Family

## 2014-05-25 VITALS — BP 110/80 | HR 66 | Temp 98.0°F | Resp 16 | Ht 63.5 in | Wt 165.8 lb

## 2014-05-25 DIAGNOSIS — Z0000 Encounter for general adult medical examination without abnormal findings: Secondary | ICD-10-CM

## 2014-05-25 DIAGNOSIS — E119 Type 2 diabetes mellitus without complications: Secondary | ICD-10-CM

## 2014-05-25 LAB — CBC WITH DIFFERENTIAL/PLATELET
BASOS ABS: 0.1 10*3/uL (ref 0.0–0.1)
BASOS PCT: 0.8 % (ref 0.0–3.0)
EOS ABS: 0.1 10*3/uL (ref 0.0–0.7)
EOS PCT: 1.8 % (ref 0.0–5.0)
HCT: 39.7 % (ref 36.0–46.0)
Hemoglobin: 13.9 g/dL (ref 12.0–15.0)
LYMPHS ABS: 2.6 10*3/uL (ref 0.7–4.0)
Lymphocytes Relative: 35.5 % (ref 12.0–46.0)
MCHC: 35 g/dL (ref 30.0–36.0)
MCV: 87.2 fl (ref 78.0–100.0)
Monocytes Absolute: 0.5 10*3/uL (ref 0.1–1.0)
Monocytes Relative: 6.8 % (ref 3.0–12.0)
Neutro Abs: 4.1 10*3/uL (ref 1.4–7.7)
Neutrophils Relative %: 55.1 % (ref 43.0–77.0)
PLATELETS: 329 10*3/uL (ref 150.0–400.0)
RBC: 4.55 Mil/uL (ref 3.87–5.11)
RDW: 12.4 % (ref 11.5–15.5)
WBC: 7.4 10*3/uL (ref 4.0–10.5)

## 2014-05-25 LAB — URINALYSIS, ROUTINE W REFLEX MICROSCOPIC
Bilirubin Urine: NEGATIVE
Hgb urine dipstick: NEGATIVE
Ketones, ur: NEGATIVE
LEUKOCYTES UA: NEGATIVE
Nitrite: NEGATIVE
PH: 5.5 (ref 5.0–8.0)
RBC / HPF: NONE SEEN (ref 0–?)
Specific Gravity, Urine: >=1.03 — AB (ref 1.000–1.030)
TOTAL PROTEIN, URINE-UPE24: NEGATIVE
URINE GLUCOSE: NEGATIVE
UROBILINOGEN UA: 0.2 (ref 0.0–1.0)

## 2014-05-25 LAB — HEPATIC FUNCTION PANEL
ALBUMIN: 4.8 g/dL (ref 3.5–5.2)
ALT: 23 U/L (ref 0–35)
AST: 16 U/L (ref 0–37)
Alkaline Phosphatase: 51 U/L (ref 39–117)
BILIRUBIN TOTAL: 0.7 mg/dL (ref 0.2–1.2)
Bilirubin, Direct: 0.1 mg/dL (ref 0.0–0.3)
Total Protein: 8.3 g/dL (ref 6.0–8.3)

## 2014-05-25 MED ORDER — AMLODIPINE BESYLATE 5 MG PO TABS: ORAL_TABLET | ORAL | Status: DC

## 2014-05-25 MED ORDER — METFORMIN HCL 500 MG PO TABS: 500.0000 mg | ORAL_TABLET | Freq: Two times a day (BID) | ORAL | Status: DC

## 2014-05-25 MED ORDER — LISINOPRIL 2.5 MG PO TABS: ORAL_TABLET | ORAL | Status: DC

## 2014-05-25 MED ORDER — ATORVASTATIN CALCIUM 40 MG PO TABS: 40.0000 mg | ORAL_TABLET | Freq: Every day | ORAL | Status: DC

## 2014-05-25 MED ORDER — LEVOTHYROXINE SODIUM 75 MCG PO TABS: 75.0000 ug | ORAL_TABLET | Freq: Every day | ORAL | Status: DC

## 2014-05-25 NOTE — Progress Notes (Signed)
Subjective:    Patient ID: Alexandria Ayers, female    DOB: Nov 16, 1965, 49 y.o.   MRN: [id number]  HPI  Patient presents today for complete physical.  Immunizations: up to date Diet: reports diet is fair, could be better Exercise:  Exercises 2x a week, table tennis Colonoscopy: up to date 5 years ago in [place], normal per pt Dexa: still menstruating Pap Smear: 2014- GYN Mammogram: up to date Vision exam- 3 yrs ago.    Wt Readings from Last 3 Encounters:  05/25/14 165 lb 12.8 oz (75.206 kg)  04/19/14 169 lb 2 oz (76.715 kg)  10/05/13 166 lb (75.297 kg)   Lab Results  Component Value Date   HGBA1C 8.8* 04/19/2014   Declined increase in DM meds. Wants to work on diet and exercise.    Review of Systems  Constitutional: Negative for unexpected weight change.  HENT: Negative for hearing loss and rhinorrhea.   Eyes: Negative for visual disturbance.  Respiratory: Negative for cough.   Cardiovascular: Negative for leg swelling.  Gastrointestinal: Negative for nausea and constipation.       Occasional diarrhea  Genitourinary: Negative for dysuria and frequency.  Musculoskeletal: Negative for myalgias.       Occasional bilat knee pain  Skin: Negative for rash.  Neurological: Negative for headaches.  Hematological: Negative for adenopathy.  Psychiatric/Behavioral: Negative for dysphoric mood and agitation.       Stress level was up due to her mother's illness   Past Medical History  Diagnosis Date  . Abnormal glucose   . Hyperlipidemia   . Hypertension   . History of hemorrhoids   . Lipoma   . Obesity   . Atypical squamous cell changes of undetermined significance (ASCUS) on cervical cytology with positive high risk human papilloma virus (HPV) 02/05/2013    History   Social History  . Marital Status: Married    Spouse Name: N/A  . Number of Children: N/A  . Years of Education: N/A   Occupational History  . Not on file.   Social History Main Topics  . Smoking  status: Never Smoker   . Smokeless tobacco: Not on file  . Alcohol Use: Not on file  . Drug Use: Not on file  . Sexual Activity: Not on file   Other Topics Concern  . Not on file   Social History Narrative   Occupation: Housewife      Married      Never Smoked      Alcohol use-no      Daughter - Marlowe Kays (8y/o)    History reviewed. No pertinent past surgical history.  Family History  Problem Relation Age of Onset  . Hypertension Mother   . Kidney disease Mother     chronic renal insufficiency or some manner of kidney abnormality  . Hypothyroidism Mother     s/p thryroid surgery and is hypothyroid  . Osteoporosis Mother   . Coronary artery disease      sibling  . Other      denies FH of colon cancer,and diabetes  . Hypertension Father   . Hyperlipidemia Father     No Known Allergies  Current Outpatient Prescriptions on File Prior to Visit  Medication Sig Dispense Refill  . amLODipine (NORVASC) 5 MG tablet TAKE 1 TABLET (5 MG TOTAL) BY MOUTH DAILY. 90 tablet 1  . atorvastatin (LIPITOR) 40 MG tablet Take 40 mg by mouth daily.    Marland Kitchen glucose blood (FREESTYLE LITE) test strip Use to  check blood sugar once a day     . levothyroxine (SYNTHROID, LEVOTHROID) 75 MCG tablet Take 75 mcg by mouth daily before breakfast.    . lisinopril (PRINIVIL,ZESTRIL) 2.5 MG tablet TAKE 1 TABLET (2.5 MG TOTAL) BY MOUTH DAILY. 90 tablet 1  . metFORMIN (GLUCOPHAGE) 500 MG tablet Take 500 mg by mouth 2 (two) times daily with a meal.     No current facility-administered medications on file prior to visit.    BP 110/80 mmHg  Pulse 66  Temp(Src) 98 F (36.7 C) (Oral)  Resp 16  Ht 5' 3.5" (1.613 m)  Wt 165 lb 12.8 oz (75.206 kg)  BMI 28.91 kg/m2  SpO2 99%  LMP 04/27/2014       Objective:   Physical Exam Physical Exam  Constitutional: She is oriented to person, place, and time. She appears well-developed and well-nourished. No distress.  HENT:  Head: Normocephalic and atraumatic.  Right  Ear: Tympanic membrane and ear canal normal.  Left Ear: Tympanic membrane and ear canal normal.  Mouth/Throat: Oropharynx is clear and moist.  Eyes: Pupils are equal, round, and reactive to light. No scleral icterus.  Neck: Normal range of motion. No thyromegaly present.  Cardiovascular: Normal rate and regular rhythm.   No murmur heard. Pulmonary/Chest: Effort normal and breath sounds normal. No respiratory distress. He has no wheezes. She has no rales. She exhibits no tenderness.  Abdominal: Soft. Bowel sounds are normal. He exhibits no distension and no mass. There is no tenderness. There is no rebound and no guarding.  Musculoskeletal: She exhibits no edema.  Lymphadenopathy:    She has no cervical adenopathy.  Neurological: She is alert and oriented to person, place, and time. She has normal patellar reflexes. She exhibits normal muscle tone. Coordination normal.  Skin: Skin is warm and dry.  Psychiatric: She has a normal mood and affect. Her behavior is normal. Judgment and thought content normal.  Breasts: Examined lying Right: Without masses, retractions, discharge or axillary adenopathy.  Left: Without masses, retractions, discharge or axillary adenopathy.           Assessment & Plan:          Assessment & Plan:

## 2014-05-25 NOTE — Patient Instructions (Signed)
Please complete lab work prior to leaving. Follow up in 2-3 months.

## 2014-05-25 NOTE — Progress Notes (Signed)
Pre visit review using our clinic review tool, if applicable. No additional management support is needed unless otherwise documented below in the visit note. 

## 2014-05-26 ENCOUNTER — Encounter: Payer: Self-pay | Admitting: Family

## 2014-05-26 NOTE — Assessment & Plan Note (Signed)
Discussed improving diet.   Continue exercise, obtain routine lab work.  Immunizations reviewed and up to date.

## 2014-06-27 LAB — HM DIABETES EYE EXAM

## 2014-07-18 ENCOUNTER — Encounter: Payer: Self-pay | Admitting: Family

## 2014-08-30 ENCOUNTER — Ambulatory Visit: Payer: 59 | Admitting: Family

## 2014-09-13 ENCOUNTER — Ambulatory Visit: Payer: 59 | Admitting: Family

## 2014-10-11 ENCOUNTER — Ambulatory Visit (INDEPENDENT_AMBULATORY_CARE_PROVIDER_SITE_OTHER): Payer: 59 | Admitting: Family

## 2014-10-11 ENCOUNTER — Encounter: Payer: Self-pay | Admitting: Family

## 2014-10-11 ENCOUNTER — Other Ambulatory Visit: Payer: Self-pay | Admitting: Family

## 2014-10-11 VITALS — BP 104/72 | HR 66 | Temp 98.1°F | Resp 16 | Ht 64.0 in | Wt 163.0 lb

## 2014-10-11 DIAGNOSIS — E039 Hypothyroidism, unspecified: Secondary | ICD-10-CM

## 2014-10-11 DIAGNOSIS — E1129 Type 2 diabetes mellitus with other diabetic kidney complication: Secondary | ICD-10-CM | POA: Diagnosis not present

## 2014-10-11 DIAGNOSIS — IMO0002 Reserved for concepts with insufficient information to code with codable children: Secondary | ICD-10-CM

## 2014-10-11 DIAGNOSIS — E1165 Type 2 diabetes mellitus with hyperglycemia: Secondary | ICD-10-CM

## 2014-10-11 DIAGNOSIS — E785 Hyperlipidemia, unspecified: Secondary | ICD-10-CM | POA: Diagnosis not present

## 2014-10-11 DIAGNOSIS — E041 Nontoxic single thyroid nodule: Secondary | ICD-10-CM

## 2014-10-11 LAB — BASIC METABOLIC PANEL
BUN: 11 mg/dL (ref 6–23)
CHLORIDE: 102 meq/L (ref 96–112)
CO2: 25 mEq/L (ref 19–32)
Calcium: 9.6 mg/dL (ref 8.4–10.5)
Creatinine, Ser: 0.5 mg/dL (ref 0.40–1.20)
GFR: 138.28 mL/min (ref >60.00)
Glucose, Bld: 150 mg/dL — ABNORMAL HIGH (ref 70–99)
Potassium: 4.3 mEq/L (ref 3.5–5.1)
Sodium: 136 mEq/L (ref 135–145)

## 2014-10-11 LAB — HEMOGLOBIN A1C: Hgb A1c MFr Bld: 7.9 % — ABNORMAL HIGH (ref 4.6–6.5)

## 2014-10-11 LAB — LIPID PANEL
CHOLESTEROL: 202 mg/dL — AB (ref 0–200)
HDL: 45.8 mg/dL (ref >39.00)
NonHDL: 156.2
TRIGLYCERIDES: 285 mg/dL — AB (ref 0.0–149.0)
Total CHOL/HDL Ratio: 4
VLDL: 57 mg/dL — ABNORMAL HIGH (ref 0.0–40.0)

## 2014-10-11 LAB — LDL CHOLESTEROL, DIRECT: Direct LDL: 117 mg/dL

## 2014-10-11 LAB — TSH: TSH: 2.14 u[IU]/mL (ref 0.35–4.50)

## 2014-10-11 NOTE — Assessment & Plan Note (Signed)
Obtain follow up TSH, continue synthroid.

## 2014-10-11 NOTE — Assessment & Plan Note (Signed)
Obtain follow up flp, continue statin.

## 2014-10-11 NOTE — Assessment & Plan Note (Signed)
Had stable [place]- needs follow up thyroid US in 2/17.

## 2014-10-11 NOTE — Progress Notes (Signed)
Pre visit review using our clinic review tool, if applicable. No additional management support is needed unless otherwise documented below in the visit note. 

## 2014-10-11 NOTE — Assessment & Plan Note (Signed)
Pt was given hand out in [place] for diabetic [place] diet recommendations. Obtain A1C, continue metformin.

## 2014-10-11 NOTE — Patient Instructions (Addendum)
Please complete lab work prior to leaving.   

## 2014-10-11 NOTE — Progress Notes (Signed)
Subjective:    Patient ID: Alexandria Ayers, female    DOB: Mar 22, 1965, 49 y.o.   MRN: [id number]  HPI  Alexandria Ayers is a 49 yr old female who presents today for follow up.  Patient presents today for follow up of multiple medical problems.  [place] interpretor is present.    Diabetes Type 2  Pt is currently maintained on the following medications for diabetes: metformin  Lab Results  Component Value Date   HGBA1C 8.8* 04/19/2014   HGBA1C 7.4* 10/05/2013   HGBA1C 6.8* 05/10/2013    Lab Results  Component Value Date   MICROALBUR 1.5 04/19/2014   LDLCALC 71 10/05/2013   CREATININE 0.54 04/19/2014    Last diabetic eye exam was No results found for: HMDIABEYEEXA Denies polyuria/polydipsia. Denies hypoglycemia Home glucose readings range 160 today fasting  Hyperlipidemia  Patient is currently maintained on the following medication for hyperlipidemia: atorvastatin Last lipid panel as follows:  Lab Results  Component Value Date   CHOL 219* 04/19/2014   HDL 39.90 04/19/2014   LDLCALC 71 10/05/2013   LDLDIRECT 108.0 04/19/2014   TRIG * 04/19/2014    508.0 Triglyceride is over 400; calculations on Lipids are invalid.   CHOLHDL 5 04/19/2014   Patient denies myalgia. Patient reports good compliance with low fat/low cholesterol diet.   Hypertension  Patient is currently maintained on the following medications for blood pressure: lisinopril, amlodipine Patient reports good compliance with blood pressure medications. Patient denies chest pain, shortness of breath or swelling. Last 3 blood pressure readings in our office are as follows: BP Readings from Last 3 Encounters:  10/11/14 104/72  05/25/14 110/80  04/19/14 124/80   Hypothyroid- maintained on synthroid.   Lab Results  Component Value Date   TSH 5.48* 04/19/2014     Review of Systems See HPI  Past Medical History  Diagnosis Date  . Abnormal glucose   . Hyperlipidemia   . Hypertension   . History of  hemorrhoids   . Lipoma   . Obesity   . Atypical squamous cell changes of undetermined significance (ASCUS) on cervical cytology with positive high risk human papilloma virus (HPV) 02/05/2013    History   Social History  . Marital Status: Married    Spouse Name: N/A  . Number of Children: N/A  . Years of Education: N/A   Occupational History  . Not on file.   Social History Main Topics  . Smoking status: Never Smoker   . Smokeless tobacco: Not on file  . Alcohol Use: Not on file  . Drug Use: Not on file  . Sexual Activity: Not on file   Other Topics Concern  . Not on file   Social History Narrative   Occupation: Housewife      Married      Never Smoked      Alcohol use-no      Daughter - Marlowe Kays (8y/o)    No past surgical history on file.  Family History  Problem Relation Age of Onset  . Hypertension Mother   . Kidney disease Mother     chronic renal insufficiency or some manner of kidney abnormality  . Hypothyroidism Mother     s/p thryroid surgery and is hypothyroid  . Osteoporosis Mother   . Coronary artery disease      sibling  . Other      denies FH of colon cancer,and diabetes  . Hypertension Father   . Hyperlipidemia Father     No Known  Allergies  Current Outpatient Prescriptions on File Prior to Visit  Medication Sig Dispense Refill  . amLODipine (NORVASC) 5 MG tablet TAKE 1 TABLET (5 MG TOTAL) BY MOUTH DAILY. 90 tablet 1  . atorvastatin (LIPITOR) 40 MG tablet Take 1 tablet (40 mg total) by mouth daily. 90 tablet 1  . glucose blood (FREESTYLE LITE) test strip Use to check blood sugar once a day     . levothyroxine (SYNTHROID, LEVOTHROID) 75 MCG tablet Take 1 tablet (75 mcg total) by mouth daily before breakfast. 90 tablet 1  . lisinopril (PRINIVIL,ZESTRIL) 2.5 MG tablet TAKE 1 TABLET (2.5 MG TOTAL) BY MOUTH DAILY. 90 tablet 1  . metFORMIN (GLUCOPHAGE) 500 MG tablet Take 1 tablet (500 mg total) by mouth 2 (two) times daily with a meal. 180 tablet 1     No current facility-administered medications on file prior to visit.    BP 104/72 mmHg  Pulse 66  Temp(Src) 98.1 F (36.7 C) (Oral)  Resp 16  Ht 5\' 4"  (1.626 m)  Wt 163 lb (73.936 kg)  BMI 27.97 kg/m2  SpO2 99%  LMP 09/23/2014       Objective:   Physical Exam  Constitutional: She is oriented to person, place, and time. She appears well-developed and well-nourished.  HENT:  Head: Normocephalic and atraumatic.  Cardiovascular: Normal rate, regular rhythm and normal heart sounds.   No murmur heard. Pulmonary/Chest: Effort normal and breath sounds normal. No respiratory distress. She has no wheezes.  Musculoskeletal: She exhibits no edema.  Neurological: She is alert and oriented to person, place, and time.  Psychiatric: She has a normal mood and affect. Her behavior is normal. Judgment and thought content normal.          Assessment & Plan:

## 2014-10-12 ENCOUNTER — Telehealth: Payer: Self-pay | Admitting: Family

## 2014-10-12 MED ORDER — SITAGLIPTIN PHOS-METFORMIN HCL 50-500 MG PO TABS: 1.0000 | ORAL_TABLET | Freq: Two times a day (BID) | ORAL | Status: DC

## 2014-10-12 MED ORDER — ATORVASTATIN CALCIUM 80 MG PO TABS: 80.0000 mg | ORAL_TABLET | Freq: Every day | ORAL | Status: DC

## 2014-10-12 NOTE — Telephone Encounter (Signed)
Pt called back and was notified of below results.  Pt voices understanding.

## 2014-10-12 NOTE — Telephone Encounter (Signed)
Left message for pt to return my call.

## 2014-10-12 NOTE — Telephone Encounter (Signed)
Sugar looks better but still above goal. I would advise pt to stop metformin, start janumet. Continue to work on diet/exercise.  Cholesterol is improving, but still above goal. Advise pt to increase lipitor to 80mg .

## 2015-01-11 ENCOUNTER — Ambulatory Visit: Payer: 59 | Admitting: Family

## 2015-01-20 ENCOUNTER — Other Ambulatory Visit: Payer: Self-pay | Admitting: Family

## 2015-01-20 NOTE — Telephone Encounter (Signed)
Refill sent per [facility] refill protocol/SLS Medication Detail      Disp Refills Start End     levothyroxine (SYNTHROID, LEVOTHROID) 75 MCG tablet 90 tablet 1 05/25/2014     Sig - Route: Take 1 tablet (75 mcg total) by mouth daily before breakfast. - Oral    E-Prescribing Status: Receipt confirmed by pharmacy (05/25/2014 11:19 AM EST)   Pharmacy    [id number] - [place], [place] [address]

## 2015-03-29 ENCOUNTER — Ambulatory Visit: Payer: 59 | Admitting: Family

## 2015-04-15 ENCOUNTER — Other Ambulatory Visit: Payer: Self-pay | Admitting: Family

## 2015-04-28 ENCOUNTER — Ambulatory Visit (INDEPENDENT_AMBULATORY_CARE_PROVIDER_SITE_OTHER): Payer: 59 | Admitting: Family

## 2015-04-28 ENCOUNTER — Encounter: Payer: Self-pay | Admitting: Family

## 2015-04-28 VITALS — BP 120/81 | HR 73 | Temp 98.4°F | Resp 16 | Ht 64.0 in | Wt 162.2 lb

## 2015-04-28 DIAGNOSIS — Z0000 Encounter for general adult medical examination without abnormal findings: Secondary | ICD-10-CM

## 2015-04-28 DIAGNOSIS — I10 Essential (primary) hypertension: Secondary | ICD-10-CM

## 2015-04-28 DIAGNOSIS — E039 Hypothyroidism, unspecified: Secondary | ICD-10-CM

## 2015-04-28 DIAGNOSIS — E785 Hyperlipidemia, unspecified: Secondary | ICD-10-CM | POA: Diagnosis not present

## 2015-04-28 DIAGNOSIS — E1165 Type 2 diabetes mellitus with hyperglycemia: Secondary | ICD-10-CM | POA: Diagnosis not present

## 2015-04-28 DIAGNOSIS — IMO0001 Reserved for inherently not codable concepts without codable children: Secondary | ICD-10-CM

## 2015-04-28 DIAGNOSIS — E1121 Type 2 diabetes mellitus with diabetic nephropathy: Secondary | ICD-10-CM

## 2015-04-28 DIAGNOSIS — IMO0002 Reserved for concepts with insufficient information to code with codable children: Secondary | ICD-10-CM

## 2015-04-28 LAB — HEMOGLOBIN A1C: Hgb A1c MFr Bld: 8.3 % — ABNORMAL HIGH (ref 4.6–6.5)

## 2015-04-28 LAB — BASIC METABOLIC PANEL
BUN: 13 mg/dL (ref 6–23)
CALCIUM: 9.5 mg/dL (ref 8.4–10.5)
CHLORIDE: 103 meq/L (ref 96–112)
CO2: 28 meq/L (ref 19–32)
Creatinine, Ser: 0.52 mg/dL (ref 0.40–1.20)
GFR: 131.88 mL/min (ref >60.00)
Glucose, Bld: 231 mg/dL — ABNORMAL HIGH (ref 70–99)
POTASSIUM: 4.1 meq/L (ref 3.5–5.1)
Sodium: 137 mEq/L (ref 135–145)

## 2015-04-28 LAB — LDL CHOLESTEROL, DIRECT: LDL DIRECT: 143 mg/dL

## 2015-04-28 LAB — LIPID PANEL
CHOLESTEROL: 222 mg/dL — AB (ref 0–200)
HDL: 42.3 mg/dL (ref >39.00)
NONHDL: 179.46
TRIGLYCERIDES: 222 mg/dL — AB (ref 0.0–149.0)
Total CHOL/HDL Ratio: 5
VLDL: 44.4 mg/dL — ABNORMAL HIGH (ref 0.0–40.0)

## 2015-04-28 MED ORDER — AMLODIPINE BESYLATE 5 MG PO TABS: ORAL_TABLET | ORAL | Status: DC

## 2015-04-28 MED ORDER — LISINOPRIL 2.5 MG PO TABS: ORAL_TABLET | ORAL | Status: DC

## 2015-04-28 MED ORDER — ATORVASTATIN CALCIUM 80 MG PO TABS: 80.0000 mg | ORAL_TABLET | Freq: Every day | ORAL | Status: DC

## 2015-04-28 MED ORDER — SITAGLIPTIN PHOS-METFORMIN HCL 50-500 MG PO TABS: 1.0000 | ORAL_TABLET | Freq: Two times a day (BID) | ORAL | Status: DC

## 2015-04-28 MED ORDER — LEVOTHYROXINE SODIUM 75 MCG PO TABS: ORAL_TABLET | ORAL | Status: DC

## 2015-04-28 NOTE — Assessment & Plan Note (Signed)
Obtain tsh. Continue synthroid.

## 2015-04-28 NOTE — Assessment & Plan Note (Signed)
BP looks good, continue ace.

## 2015-04-28 NOTE — Assessment & Plan Note (Addendum)
Obtain follow up A1C. If A1C >7, consider addition of another oral med.

## 2015-04-28 NOTE — Assessment & Plan Note (Signed)
Tolerating statin, obtain flp 

## 2015-04-28 NOTE — Patient Instructions (Addendum)
Please complete lab work prior to leaving.  Keep working on [employer].

## 2015-04-28 NOTE — Progress Notes (Signed)
Pre visit review using our clinic review tool, if applicable. No additional management support is needed unless otherwise documented below in the visit note. 

## 2015-04-28 NOTE — Progress Notes (Signed)
Subjective:    Patient ID: Alexandria Ayers, female    DOB: [birth year], 50 y.o.   MRN: [id number]  HPI  Alexandria Ayers a 50 yr old female who presents for follow up.   Hyperlipidemia- maintained on lipitor.  Lab Results  Component Value Date   CHOL 202* 10/11/2014   HDL 45.80 10/11/2014   LDLCALC 71 10/05/2013   LDLDIRECT 117.0 10/11/2014   TRIG 285.0* 10/11/2014   CHOLHDL 4 10/11/2014   Hypothyroid- maintained on synthroid. Lab Results  Component Value Date   TSH 2.14 10/11/2014   HTN- currently on low dose ACE BP Readings from Last 3 Encounters:  04/28/15 120/81  10/11/14 104/72  05/25/14 110/80   DM2- Maintained on janumet.  Checks 2x week. 140-160.  Working on diet.  Walks 1 hour 5 days a week.  Lab Results  Component Value Date   HGBA1C 7.9* 10/11/2014   HGBA1C 8.8* 04/19/2014   HGBA1C 7.4* 10/05/2013   Lab Results  Component Value Date   MICROALBUR 1.5 04/19/2014   LDLCALC 71 10/05/2013   CREATININE 0.50 10/11/2014     Review of Systems  Constitutional: Negative for unexpected weight change.  Eyes: Negative for visual disturbance.  Cardiovascular: Negative for chest pain, palpitations and leg swelling.  Gastrointestinal: Negative for nausea and abdominal distention.  Skin: Negative for rash.  Neurological: Negative for dizziness, light-headedness and headaches.   Past Medical History  Diagnosis Date  . Abnormal glucose   . Hyperlipidemia   . Hypertension   . History of hemorrhoids   . Lipoma   . Obesity   . Atypical squamous cell changes of undetermined significance (ASCUS) on cervical cytology with positive high risk human papilloma virus (HPV) 02/05/2013    Social History   Social History  . Marital Status: Married    Spouse Name: N/A  . Number of Children: N/A  . Years of Education: N/A   Occupational History  . Not on file.   Social History Main Topics  . Smoking status: Never Smoker   . Smokeless tobacco: Not on file  . Alcohol Use:  Not on file  . Drug Use: Not on file  . Sexual Activity: Not on file   Other Topics Concern  . Not on file   Social History Narrative   Occupation: Housewife      Married      Never Smoked      Alcohol use-no      Daughter - Marlowe Kays (8y/o)    No past surgical history on file.  Family History  Problem Relation Age of Onset  . Hypertension Mother   . Kidney disease Mother     chronic renal insufficiency or some manner of kidney abnormality  . Hypothyroidism Mother     s/p thryroid surgery and is hypothyroid  . Osteoporosis Mother   . Coronary artery disease      sibling  . Other      denies FH of colon cancer,and diabetes  . Hypertension Father   . Hyperlipidemia Father     No Known Allergies  Current Outpatient Prescriptions on File Prior to Visit  Medication Sig Dispense Refill  . amLODipine (NORVASC) 5 MG tablet TAKE 1 TABLET (5 MG TOTAL) BY MOUTH DAILY. 90 tablet 1  . atorvastatin (LIPITOR) 80 MG tablet Take 1 tablet (80 mg total) by mouth daily. 30 tablet 5  . glucose blood (FREESTYLE LITE) test strip Use to check blood sugar once a day     .  levothyroxine (SYNTHROID, LEVOTHROID) 75 MCG tablet TAKE 1 TABLET (75 MCG TOTAL) BY MOUTH DAILY. CANNOT GET 90 DAYS SUPPLY PER INSURANCE 30 tablet 2  . lisinopril (PRINIVIL,ZESTRIL) 2.5 MG tablet TAKE 1 TABLET (2.5 MG TOTAL) BY MOUTH DAILY.INS ALLOWS 30 DAYS 90 tablet 1  . sitaGLIPtin-metformin (JANUMET) 50-500 MG per tablet Take 1 tablet by mouth 2 (two) times daily with a meal. 60 tablet 5   No current facility-administered medications on file prior to visit.    BP 120/81 mmHg  Pulse 73  Temp(Src) 98.4 F (36.9 C) (Oral)  Resp 16  Ht 5\' 4"  (1.626 m)  Wt 162 lb 3.2 oz (73.573 kg)  BMI 27.83 kg/m2  SpO2 98%  LMP 04/14/2015       Objective:   Physical Exam  Constitutional: She appears well-developed and well-nourished.  Cardiovascular: Normal rate, regular rhythm and normal heart sounds.   No murmur  heard. Pulses:      Dorsalis pedis pulses are 2+ on the right side, and 2+ on the left side.       Posterior tibial pulses are 2+ on the right side, and 2+ on the left side.  Pulmonary/Chest: Effort normal and breath sounds normal. No respiratory distress. She has no wheezes.  Musculoskeletal: She exhibits no edema.  Psychiatric: She has a normal mood and affect. Her behavior is normal. Judgment and thought content normal.          Assessment & Plan:

## 2015-05-01 ENCOUNTER — Telehealth: Payer: Self-pay | Admitting: *Deleted

## 2015-05-01 ENCOUNTER — Other Ambulatory Visit: Payer: Self-pay | Admitting: Family

## 2015-05-01 MED ORDER — GLIMEPIRIDE 2 MG PO TABS: 2.0000 mg | ORAL_TABLET | Freq: Every day | ORAL | Status: DC

## 2015-05-01 NOTE — Telephone Encounter (Signed)
Received request for PA on Janumet 50-500 mg/SLS 02/13

## 2015-05-01 NOTE — Telephone Encounter (Signed)
Lipids above goal. Has she missed doses of statin?  If taking regularly we will need to d/c atorvastatin and start crestor 20mg  once daily.  Sugar above goal. Continue janumet, Add amaryl, continue to work on diabetic diet.

## 2015-05-01 NOTE — Telephone Encounter (Signed)
Notified pt and spouse and pt states that she had missed some doses of cholesterol medication. Advised her to take atorvastatin every day. Pt agreeable to start amaryl, Rx sent.

## 2015-05-02 ENCOUNTER — Ambulatory Visit (HOSPITAL_BASED_OUTPATIENT_CLINIC_OR_DEPARTMENT_OTHER)
Admission: RE | Admit: 2015-05-02 | Discharge: 2015-05-02 | Disposition: A | Discharge status: Home / Self Care | Payer: 59 | Source: Ambulatory Visit | Attending: Family | Admitting: Family

## 2015-05-02 DIAGNOSIS — Z1231 Encounter for screening mammogram for malignant neoplasm of breast: Secondary | ICD-10-CM | POA: Insufficient documentation

## 2015-05-02 DIAGNOSIS — Z0000 Encounter for general adult medical examination without abnormal findings: Secondary | ICD-10-CM | POA: Insufficient documentation

## 2015-07-24 ENCOUNTER — Telehealth: Payer: Self-pay | Admitting: *Deleted

## 2015-07-24 MED ORDER — LEVOTHYROXINE SODIUM 75 MCG PO TABS: ORAL_TABLET | ORAL | Status: DC

## 2015-07-24 NOTE — Telephone Encounter (Signed)
[facility] that insurance will not cover 90 day supply of levothyroxine and requests new Rx. Refills sent.

## 2015-07-26 ENCOUNTER — Encounter: Payer: 59 | Admitting: Family

## 2015-10-30 ENCOUNTER — Other Ambulatory Visit: Payer: Self-pay | Admitting: Family

## 2015-10-30 NOTE — Telephone Encounter (Signed)
Lab Results  Component Value Date   TSH 2.14 10/11/2014    Okay for 30 day supply and schedule pt for follow-up appt?

## 2015-10-30 NOTE — Telephone Encounter (Signed)
Refill sent, due for OV prior to additional refills.

## 2015-11-06 NOTE — Telephone Encounter (Signed)
please schedule this patient and OV with Melissa.    KP

## 2015-11-17 NOTE — Telephone Encounter (Signed)
lvm advising patient to schedule appointment °

## 2015-12-05 ENCOUNTER — Other Ambulatory Visit: Payer: Self-pay | Admitting: Family

## 2015-12-05 NOTE — Telephone Encounter (Signed)
Denial sent to pharmacy for levothyroxine and atorvastatin as pt was due for fasting physical in May and is past due for follow up with Melissa. Messages were left for pt at last refill  (10/30/15) regarding need for appt before further refills.

## 2015-12-20 ENCOUNTER — Other Ambulatory Visit: Payer: Self-pay | Admitting: Family

## 2015-12-29 ENCOUNTER — Telehealth: Payer: Self-pay | Admitting: *Deleted

## 2015-12-29 MED ORDER — LISINOPRIL 2.5 MG PO TABS: ORAL_TABLET | ORAL | 0 refills | Status: DC

## 2015-12-29 MED ORDER — LEVOTHYROXINE SODIUM 75 MCG PO TABS: ORAL_TABLET | ORAL | 0 refills | Status: DC

## 2015-12-29 MED ORDER — GLIMEPIRIDE 2 MG PO TABS: 2.0000 mg | ORAL_TABLET | Freq: Every day | ORAL | 0 refills | Status: DC

## 2015-12-29 MED ORDER — AMLODIPINE BESYLATE 5 MG PO TABS: ORAL_TABLET | ORAL | 0 refills | Status: DC

## 2015-12-29 NOTE — Telephone Encounter (Signed)
Received refill requests from [facility] for 90 day Rxs on: levothyroxine, lisinopril, amlodipine and glimepiride. Pt last seen by PCP 04/2015 and advise CPE in 3 months. Pt is past due and scheduled to see PCP 01/17/16. Will send 90 day supply only.

## 2016-01-17 ENCOUNTER — Encounter: Payer: 59 | Admitting: Family

## 2016-02-16 ENCOUNTER — Encounter: Payer: Self-pay | Admitting: Family

## 2016-02-16 ENCOUNTER — Ambulatory Visit (INDEPENDENT_AMBULATORY_CARE_PROVIDER_SITE_OTHER): Payer: 59 | Admitting: Family

## 2016-02-16 ENCOUNTER — Other Ambulatory Visit (HOSPITAL_COMMUNITY)
Admission: RE | Admit: 2016-02-16 | Discharge: 2016-02-16 | Disposition: A | Discharge status: Home / Self Care | Payer: 59 | Source: Ambulatory Visit | Attending: Family | Admitting: Family

## 2016-02-16 VITALS — BP 131/86 | HR 88 | Temp 98.4°F | Resp 16 | Ht 63.25 in | Wt 165.8 lb

## 2016-02-16 DIAGNOSIS — E1121 Type 2 diabetes mellitus with diabetic nephropathy: Secondary | ICD-10-CM

## 2016-02-16 DIAGNOSIS — E785 Hyperlipidemia, unspecified: Secondary | ICD-10-CM

## 2016-02-16 DIAGNOSIS — E039 Hypothyroidism, unspecified: Secondary | ICD-10-CM | POA: Diagnosis not present

## 2016-02-16 DIAGNOSIS — IMO0001 Reserved for inherently not codable concepts without codable children: Secondary | ICD-10-CM

## 2016-02-16 DIAGNOSIS — Z01419 Encounter for gynecological examination (general) (routine) without abnormal findings: Secondary | ICD-10-CM | POA: Insufficient documentation

## 2016-02-16 DIAGNOSIS — E1165 Type 2 diabetes mellitus with hyperglycemia: Secondary | ICD-10-CM

## 2016-02-16 DIAGNOSIS — IMO0002 Reserved for concepts with insufficient information to code with codable children: Secondary | ICD-10-CM

## 2016-02-16 DIAGNOSIS — Z0000 Encounter for general adult medical examination without abnormal findings: Secondary | ICD-10-CM

## 2016-02-16 DIAGNOSIS — Z1151 Encounter for screening for human papillomavirus (HPV): Secondary | ICD-10-CM | POA: Diagnosis present

## 2016-02-16 LAB — URINALYSIS, ROUTINE W REFLEX MICROSCOPIC
Bilirubin Urine: NEGATIVE
Hgb urine dipstick: NEGATIVE
KETONES UR: NEGATIVE
Leukocytes, UA: NEGATIVE
Nitrite: NEGATIVE
RBC / HPF: NONE SEEN (ref 0–?)
SPECIFIC GRAVITY, URINE: 1.025 (ref 1.000–1.030)
Total Protein, Urine: NEGATIVE
Urine Glucose: 500 — AB
Urobilinogen, UA: 0.2 (ref 0.0–1.0)
WBC, UA: NONE SEEN (ref 0–?)
pH: 6 (ref 5.0–8.0)

## 2016-02-16 LAB — CBC WITH DIFFERENTIAL/PLATELET
Basophils Absolute: 0 10*3/uL (ref 0.0–0.1)
Basophils Relative: 0.7 % (ref 0.0–3.0)
EOS PCT: 2.9 % (ref 0.0–5.0)
Eosinophils Absolute: 0.2 10*3/uL (ref 0.0–0.7)
HCT: 38.4 % (ref 36.0–46.0)
Hemoglobin: 13.1 g/dL (ref 12.0–15.0)
LYMPHS ABS: 2.3 10*3/uL (ref 0.7–4.0)
Lymphocytes Relative: 33.8 % (ref 12.0–46.0)
MCHC: 34.1 g/dL (ref 30.0–36.0)
MCV: 88 fl (ref 78.0–100.0)
MONOS PCT: 7.2 % (ref 3.0–12.0)
Monocytes Absolute: 0.5 10*3/uL (ref 0.1–1.0)
NEUTROS ABS: 3.8 10*3/uL (ref 1.4–7.7)
NEUTROS PCT: 55.4 % (ref 43.0–77.0)
PLATELETS: 342 10*3/uL (ref 150.0–400.0)
RBC: 4.36 Mil/uL (ref 3.87–5.11)
RDW: 12.6 % (ref 11.5–15.5)
WBC: 6.8 10*3/uL (ref 4.0–10.5)

## 2016-02-16 LAB — BASIC METABOLIC PANEL WITH GFR
BUN: 11 mg/dL (ref 6–23)
CO2: 28 meq/L (ref 19–32)
Calcium: 8.9 mg/dL (ref 8.4–10.5)
Chloride: 103 meq/L (ref 96–112)
Creatinine, Ser: 0.5 mg/dL (ref 0.40–1.20)
GFR: 137.55 mL/min
Glucose, Bld: 173 mg/dL — ABNORMAL HIGH (ref 70–99)
Potassium: 3.7 meq/L (ref 3.5–5.1)
Sodium: 139 meq/L (ref 135–145)

## 2016-02-16 LAB — LIPID PANEL
CHOL/HDL RATIO: 6
CHOLESTEROL: 241 mg/dL — AB (ref 0–200)
HDL: 38.7 mg/dL — AB (ref >39.00)

## 2016-02-16 LAB — HEPATIC FUNCTION PANEL
ALBUMIN: 4.2 g/dL (ref 3.5–5.2)
ALT: 22 U/L (ref 0–35)
AST: 14 U/L (ref 0–37)
Alkaline Phosphatase: 50 U/L (ref 39–117)
Bilirubin, Direct: 0 mg/dL (ref 0.0–0.3)
Total Bilirubin: 0.4 mg/dL (ref 0.2–1.2)
Total Protein: 7.4 g/dL (ref 6.0–8.3)

## 2016-02-16 LAB — LDL CHOLESTEROL, DIRECT: LDL DIRECT: 122 mg/dL

## 2016-02-16 LAB — TSH: TSH: 1.33 u[IU]/mL (ref 0.35–4.50)

## 2016-02-16 LAB — HEMOGLOBIN A1C: HEMOGLOBIN A1C: 8.2 % — AB (ref 4.6–6.5)

## 2016-02-16 NOTE — Progress Notes (Signed)
Pre visit review using our clinic review tool, if applicable. No additional management support is needed unless otherwise documented below in the visit note. 

## 2016-02-16 NOTE — Addendum Note (Signed)
Addended by: Kem Boroughs D on: 02/16/2016 04:16 PM   Modules accepted: Orders

## 2016-02-16 NOTE — Patient Instructions (Addendum)
Please complete lab work prior to leaving. Restart lipitor and janumet.

## 2016-02-16 NOTE — Progress Notes (Signed)
Subjective:    Patient ID: Alexandria Ayers, female    DOB: 02-Jun-1965, 50 y.o.   MRN: [id number]  HPI  Alexandria Ayers is a 50 yr old female who presents today for cpx. A [place] Translator is used today via Teaching laboratory technician.   Immunizations: pneumovax and tetanus are up to date. Declines flu shot. Diet: diet is "so so"  Exercise: walks 40 minutes 3-4 times a week Colonoscopy: 09/15/09 (normal per patient done in [place]) Dexa: not done Pap Smear: 09/15/12 Mammogram: 2/17- up to date  Wt Readings from Last 3 Encounters:  02/16/16 165 lb 12.8 oz (75.2 kg)  04/28/15 162 lb 3.2 oz (73.6 kg)  10/11/14 163 lb (73.9 kg)     DM2-  Last visit her diabetes was noted to be uncontrolled.  She was advised to follow back up around 07/26/15.  However we have not seen her back until now. Current DM medications include:  janumet and amaryl.  She got confused and was only taking amaryl.  Lab Results  Component Value Date   HGBA1C 8.3 (H) 04/28/2015   HGBA1C 7.9 (H) 10/11/2014   HGBA1C 8.8 (H) 04/19/2014   Lab Results  Component Value Date   MICROALBUR 1.5 04/19/2014   LDLCALC 71 10/05/2013   CREATININE 0.52 04/28/2015   Hypothyroid- maintained on synthroid 76mcg once daily.  Lab Results  Component Value Date   TSH 2.14 10/11/2014   Hyperlipidemia- maintained on lipitor 80 mg.  Stopped 1 month ago.   Lab Results  Component Value Date   CHOL 222 (H) 04/28/2015   HDL 42.30 04/28/2015   LDLCALC 71 10/05/2013   LDLDIRECT 143.0 04/28/2015   TRIG 222.0 (H) 04/28/2015   CHOLHDL 5 04/28/2015       Review of Systems  Constitutional: Negative for unexpected weight change.  HENT: Negative for hearing loss.        Mild nasal congestion from a resolving URI  Eyes: Negative for visual disturbance.  Respiratory:       Mild cough at night- improving (from recent URI)  Cardiovascular: Negative for leg swelling.  Gastrointestinal:       Mild constipation sometimes  Genitourinary: Negative for dysuria.    Musculoskeletal: Negative for arthralgias.  Skin: Negative for rash.  Neurological: Negative for headaches.  Hematological: Negative for adenopathy.  Psychiatric/Behavioral:       Denies symptoms of depression/anxiety   See HPI  Past Medical History:  Diagnosis Date  . Abnormal glucose   . Atypical squamous cell changes of undetermined significance (ASCUS) on cervical cytology with positive high risk human papilloma virus (HPV) 02/05/2013  . History of hemorrhoids   . Hyperlipidemia   . Hypertension   . Lipoma   . Obesity      Social History   Social History  . Marital status: Married    Spouse name: N/A  . Number of children: N/A  . Years of education: N/A   Occupational History  . Not on file.   Social History Main Topics  . Smoking status: Never Smoker  . Smokeless tobacco: Never Used  . Alcohol use Not on file  . Drug use: Unknown  . Sexual activity: Not on file   Other Topics Concern  . Not on file   Social History Narrative   Occupation: Housewife      Married      Never Smoked      Alcohol use-no      Daughter - Marlowe Kays (8y/o)    History  reviewed. No pertinent surgical history.  Family History  Problem Relation Age of Onset  . Hypertension Mother   . Kidney disease Mother     chronic renal insufficiency or some manner of kidney abnormality  . Hypothyroidism Mother     s/p thryroid surgery and is hypothyroid  . Osteoporosis Mother   . Coronary artery disease      sibling  . Other      denies FH of colon cancer,and diabetes  . Hypertension Father   . Hyperlipidemia Father     No Known Allergies  Current Outpatient Prescriptions on File Prior to Visit  Medication Sig Dispense Refill  . amLODipine (NORVASC) 5 MG tablet TAKE 1 TABLET (5 MG TOTAL) BY MOUTH DAILY. 90 tablet 0  . glimepiride (AMARYL) 2 MG tablet Take 1 tablet (2 mg total) by mouth daily before breakfast. 90 tablet 0  . glucose blood (FREESTYLE LITE) test strip Use to check  blood sugar once a day     . levothyroxine (SYNTHROID, LEVOTHROID) 75 MCG tablet TAKE 1 TABLET (75 MCG TOTAL) BY MOUTH DAILY. CANNOT GET 90 DAYS SUPPLY PER INSURANCE 90 tablet 0  . lisinopril (PRINIVIL,ZESTRIL) 2.5 MG tablet TAKE 1 TABLET (2.5 MG TOTAL) BY MOUTH DAILY.INS ALLOWS 30 DAYS 90 tablet 0  . atorvastatin (LIPITOR) 80 MG tablet Take 1 tablet (80 mg total) by mouth daily. (Patient not taking: Reported on 02/16/2016) 90 tablet 1  . sitaGLIPtin-metformin (JANUMET) 50-500 MG tablet Take 1 tablet by mouth 2 (two) times daily with a meal. (Patient not taking: Reported on 02/16/2016) 180 tablet 1   No current facility-administered medications on file prior to visit.     BP 131/86 (BP Location: Right Arm, Cuff Size: Normal)   Pulse 88   Temp 98.4 F (36.9 C) (Oral)   Resp 16   Ht 5' 3.25" (1.607 m)   Wt 165 lb 12.8 oz (75.2 kg)   LMP 01/26/2016   SpO2 100% Comment: room air  BMI 29.14 kg/m       Objective:   Physical Exam Physical Exam  Constitutional: She is oriented to person, place, and time. She appears well-developed and well-nourished. No distress.  HENT:  Head: Normocephalic and atraumatic.  Right Ear: Tympanic membrane and ear canal normal.  Left Ear: Tympanic membrane and ear canal normal.  Mouth/Throat: Oropharynx is clear and moist.  Eyes: Pupils are equal, round, and reactive to light. No scleral icterus.  Neck: Normal range of motion. No thyromegaly present.  Cardiovascular: Normal rate and regular rhythm.   No murmur heard. Pulmonary/Chest: Effort normal and breath sounds normal. No respiratory distress. He has no wheezes. She has no rales. She exhibits no tenderness.  Abdominal: Soft. Bowel sounds are normal. She exhibits no distension and no mass. There is no tenderness. There is no rebound and no guarding.  Musculoskeletal: She exhibits no edema.  Lymphadenopathy:    She has no cervical adenopathy.  Neurological: She is alert and oriented to person, place, and  time. She has normal patellar reflexes. She exhibits normal muscle tone. Coordination normal.  Skin: Skin is warm and dry.  Psychiatric: She has a normal mood and affect. Her behavior is normal. Judgment and thought content normal.  Breasts: Examined lying Right: Without masses, retractions, discharge or axillary adenopathy.  Left: Without masses, retractions, discharge or axillary adenopathy.  Inguinal/mons: Normal without inguinal adenopathy  External genitalia: Normal  BUS/Urethra/Skene's glands: Normal  Bladder: Normal  Vagina: Normal  Cervix: Normal  Uterus: normal  in size, shape and contour. Midline and mobile  Adnexa/parametria:  Rt: Without masses or tenderness.  Lt: Without masses or tenderness.  Anus and perineum: Normal            Assessment & Plan:   Preventative Care- discussed continuing healthy diet, exercise.  Obtain routine lab work.        Assessment & Plan:  EKG tracing is personally reviewed.  EKG notes NSR.  No acute changes.

## 2016-02-16 NOTE — Assessment & Plan Note (Signed)
Clinically stable. Obtain follow up tsh.

## 2016-02-16 NOTE — Assessment & Plan Note (Signed)
Previously uncontrolled. Advised pt to resume janumet. Obtain follow up a1c.

## 2016-02-16 NOTE — Assessment & Plan Note (Signed)
Obtain follow up lipid panel.  Restart statin.

## 2016-02-18 ENCOUNTER — Encounter: Payer: Self-pay | Admitting: Family

## 2016-02-20 LAB — CYTOLOGY - PAP
Diagnosis: NEGATIVE
HPV: NOT DETECTED

## 2016-03-01 ENCOUNTER — Other Ambulatory Visit: Payer: Self-pay | Admitting: Family

## 2016-03-04 NOTE — Telephone Encounter (Signed)
eScribe request from [facility] for refill on Amlodipine, Glimepiride, Levothyroxine, Lisinopril Last filled - 12/29/15, #90x0 Last AEX - 02/16/16 Next AEX - 3-Mths. Refill sent per [facility] refill protocol/SLS

## 2016-05-13 ENCOUNTER — Other Ambulatory Visit: Payer: Self-pay | Admitting: Family

## 2016-05-14 ENCOUNTER — Telehealth: Payer: Self-pay | Admitting: *Deleted

## 2016-05-14 MED ORDER — ATORVASTATIN CALCIUM 80 MG PO TABS
80.0000 mg | ORAL_TABLET | Freq: Every day | ORAL | Status: DC
Start: 2016-05-14 — End: 2016-07-16

## 2016-05-14 MED ORDER — SITAGLIPTIN PHOS-METFORMIN HCL 50-500 MG PO TABS: 1.0000 | ORAL_TABLET | Freq: Two times a day (BID) | ORAL | 0 refills | Status: DC

## 2016-05-14 NOTE — Telephone Encounter (Signed)
Atorvastatin Rx printed and was faxed to [phone].

## 2016-05-14 NOTE — Telephone Encounter (Signed)
Received fax from [facility] requesting Rxs for atorvastatin and metformin.  Current med list indicates atorvastatin 80mg  and Janumet 50/500mg . Refills sent for these. Pt has follow up on 05/17/16.

## 2016-05-17 ENCOUNTER — Ambulatory Visit: Payer: 59 | Admitting: Family

## 2016-05-21 ENCOUNTER — Telehealth: Payer: Self-pay | Admitting: *Deleted

## 2016-05-21 NOTE — Telephone Encounter (Signed)
Received fax from [facility] that [place] requires prior auth.  Form initiated and forwarded to PCP to complete / sign. Please see last 2 questions and advise?

## 2016-06-03 NOTE — Telephone Encounter (Signed)
Melissa, Ashlee or Angie-- do either of you have this form? I do not see that it was returned to me.

## 2016-06-04 NOTE — Telephone Encounter (Signed)
I think that was placed in the red folder.  I remember completing.

## 2016-06-05 NOTE — Telephone Encounter (Signed)
Ashlee or angie-- has this come back to either of you?

## 2016-06-17 NOTE — Telephone Encounter (Signed)
Form not seen.

## 2016-07-02 NOTE — Telephone Encounter (Signed)
Form received and initiated. Forwarded to PCP to complete high lighted sections. Please return to [facility] [place] to fax when complete.

## 2016-07-02 NOTE — Telephone Encounter (Signed)
Spoke with Angela Nevin at [place], [phone] and requested they refax PA form. Awaiting form.

## 2016-07-04 NOTE — Telephone Encounter (Signed)
Noted  

## 2016-07-08 NOTE — Telephone Encounter (Signed)
Form faxed to [facility] at [phone]. Awaiting determination.

## 2016-07-10 NOTE — Telephone Encounter (Signed)
Received denial of janumet from [employer] because pt does not have contraindication or failure to: Nash Shearer or Kombiglyze XR.    Please advise?

## 2016-07-11 ENCOUNTER — Telehealth: Payer: Self-pay | Admitting: Family

## 2016-07-11 MED ORDER — SAXAGLIPTIN-METFORMIN ER 2.5-1000 MG PO TB24: 1.0000 | ORAL_TABLET | Freq: Every day | ORAL | 1 refills | Status: DC

## 2016-07-11 NOTE — Telephone Encounter (Signed)
[employer] and spoke with June, ID [id number]; gave message to leave on VM per provider: 'This is Ivin Booty, Clinical research associate from [employer] in [place], [place]'s office regarding a change in medication. Your insurance will no longer cover your Janumet; therefore your provider has changed your medication and sent a new Rx to your mail order pharmacy, [facility]. for Kombliglyze XR [onglyza-metformin]; you will be taking one tablet daily. You will discontinue the Janumet and start taking the mew medication, Kombliglyze XR, when you receive it from [facility]'. LMOM with contact name and number for return call, if needed/SLS 04/26  Conversation: Medication Management  (Newest Message First)  July 10, 2016  2:23 PM  Ronny Flurry, CMA routed this conversation to Debbrah Alar, NP  Ronny Flurry, CMA   2:21 PM  Note    Received denial of janumet from [employer] because pt does not have contraindication or failure to: Nash Shearer or Kombiglyze XR.    Please advise?

## 2016-07-11 NOTE — Telephone Encounter (Signed)
D/c bid janumet, begin kombiglyze xr once daily. Rx sent to mail order.

## 2016-07-16 ENCOUNTER — Other Ambulatory Visit: Payer: Self-pay | Admitting: Family Medicine

## 2016-08-02 ENCOUNTER — Other Ambulatory Visit: Payer: Self-pay | Admitting: Family

## 2016-08-06 NOTE — Telephone Encounter (Signed)
Received refill requests from [facility] for:  Amlodipine Glimepiride Levothyroxine Lisinopril  Pt was due for follow up in March and is past due. Denial sent to mail order. Please call pt to schedule appt with Melissa soon! Thanks!

## 2016-08-26 ENCOUNTER — Other Ambulatory Visit: Payer: Self-pay | Admitting: Family

## 2016-08-27 ENCOUNTER — Other Ambulatory Visit: Payer: Self-pay | Admitting: Family

## 2016-08-27 NOTE — Telephone Encounter (Signed)
Refill requests from [facility] for: lisinopril, levothyroxine, glimepiride and amlodipine denied as pt is past due for follow up. Last seen 02/2016 and advised 3 month follow up with Melissa. Please call pt to schedule appt soon. Please let me know when appt has been scheduled and we may send enough supply to local pharmacy to last until appt. Thanks!

## 2016-08-28 NOTE — Telephone Encounter (Signed)
lvm advising patient to call back and schedule appointment.

## 2016-09-03 NOTE — Telephone Encounter (Signed)
Mailed letter to pt to call and schedule an appt and to discuss short term supply of medications to local pharmacy if needed.

## 2016-10-09 ENCOUNTER — Other Ambulatory Visit: Payer: Self-pay | Admitting: Family

## 2016-10-30 ENCOUNTER — Ambulatory Visit (INDEPENDENT_AMBULATORY_CARE_PROVIDER_SITE_OTHER): Payer: 59 | Admitting: Family

## 2016-10-30 ENCOUNTER — Encounter: Payer: Self-pay | Admitting: Family

## 2016-10-30 VITALS — BP 134/88 | HR 69 | Temp 98.3°F | Resp 16 | Ht 63.25 in | Wt 164.2 lb

## 2016-10-30 DIAGNOSIS — E785 Hyperlipidemia, unspecified: Secondary | ICD-10-CM | POA: Diagnosis not present

## 2016-10-30 DIAGNOSIS — E041 Nontoxic single thyroid nodule: Secondary | ICD-10-CM | POA: Diagnosis not present

## 2016-10-30 DIAGNOSIS — E118 Type 2 diabetes mellitus with unspecified complications: Secondary | ICD-10-CM | POA: Diagnosis not present

## 2016-10-30 DIAGNOSIS — E039 Hypothyroidism, unspecified: Secondary | ICD-10-CM | POA: Diagnosis not present

## 2016-10-30 DIAGNOSIS — I10 Essential (primary) hypertension: Secondary | ICD-10-CM | POA: Diagnosis not present

## 2016-10-30 LAB — COMPREHENSIVE METABOLIC PANEL
ALBUMIN: 4.6 g/dL (ref 3.5–5.2)
ALT: 45 U/L — ABNORMAL HIGH (ref 0–35)
AST: 23 U/L (ref 0–37)
Alkaline Phosphatase: 57 U/L (ref 39–117)
BUN: 10 mg/dL (ref 6–23)
CHLORIDE: 102 meq/L (ref 96–112)
CO2: 29 mEq/L (ref 19–32)
Calcium: 9.5 mg/dL (ref 8.4–10.5)
Creatinine, Ser: 0.48 mg/dL (ref 0.40–1.20)
GFR: 143.8 mL/min (ref >60.00)
Glucose, Bld: 171 mg/dL — ABNORMAL HIGH (ref 70–99)
Potassium: 3.9 mEq/L (ref 3.5–5.1)
SODIUM: 137 meq/L (ref 135–145)
Total Bilirubin: 0.8 mg/dL (ref 0.2–1.2)
Total Protein: 7.4 g/dL (ref 6.0–8.3)

## 2016-10-30 LAB — LIPID PANEL
CHOLESTEROL: 164 mg/dL (ref 0–200)
HDL: 42.4 mg/dL (ref >39.00)
LDL Cholesterol: 83 mg/dL (ref 0–99)
NONHDL: 121.55
TRIGLYCERIDES: 194 mg/dL — AB (ref 0.0–149.0)
Total CHOL/HDL Ratio: 4
VLDL: 38.8 mg/dL (ref 0.0–40.0)

## 2016-10-30 LAB — HEMOGLOBIN A1C: HEMOGLOBIN A1C: 9.4 % — AB (ref 4.6–6.5)

## 2016-10-30 LAB — TSH: TSH: 1.96 u[IU]/mL (ref 0.35–4.50)

## 2016-10-30 MED ORDER — AMLODIPINE BESYLATE 5 MG PO TABS: 5.0000 mg | ORAL_TABLET | Freq: Every day | ORAL | 1 refills | Status: DC

## 2016-10-30 MED ORDER — LEVOTHYROXINE SODIUM 75 MCG PO TABS: 75.0000 ug | ORAL_TABLET | Freq: Every day | ORAL | 1 refills | Status: DC

## 2016-10-30 MED ORDER — SAXAGLIPTIN-METFORMIN ER 2.5-1000 MG PO TB24: 1.0000 | ORAL_TABLET | Freq: Every day | ORAL | 1 refills | Status: DC

## 2016-10-30 MED ORDER — GLIMEPIRIDE 2 MG PO TABS: ORAL_TABLET | ORAL | 1 refills | Status: DC

## 2016-10-30 MED ORDER — ATORVASTATIN CALCIUM 80 MG PO TABS: 80.0000 mg | ORAL_TABLET | Freq: Every day | ORAL | 1 refills | Status: DC

## 2016-10-30 MED ORDER — LISINOPRIL 2.5 MG PO TABS: 2.5000 mg | ORAL_TABLET | Freq: Every day | ORAL | 1 refills | Status: DC

## 2016-10-30 NOTE — Patient Instructions (Signed)
Please complete lab work prior to leaving. Follow up in 3 months.  

## 2016-10-30 NOTE — Progress Notes (Signed)
Subjective:    Patient ID: Alexandria Ayers, female    DOB: 08/23/1965, 51 y.o.   MRN: [id number]  HPI  Ms. Alexandria Ayers is a 51 year old female who presents today for follow-up of multiple medical problems. She is accompanied by her daughter today who assists with translation.  Diabetes type 2-current medications include Kombiglyze XR 2. 07-998 milligrams and Amaryl 2 mg. She reports that she has been out of one of her amaryl x 6 months. Reports that she has been out because an issue with her mail order. Lab Results  Component Value Date   HGBA1C 8.2 (H) 02/16/2016   HGBA1C 8.3 (H) 04/28/2015   HGBA1C 7.9 (H) 10/11/2014   Lab Results  Component Value Date   MICROALBUR 1.5 04/19/2014   LDLCALC 71 10/05/2013   CREATININE 0.50 02/16/2016   Hyperlipidemia- she is maintained on a atorvastatin 80 mg once daily. Denies myalgia.  Lab Results  Component Value Date   CHOL 241 (H) 02/16/2016   HDL 38.70 (L) 02/16/2016   LDLCALC 71 10/05/2013   LDLDIRECT 122.0 02/16/2016   TRIG (H) 02/16/2016    463.0 Triglyceride is over 400; calculations on Lipids are invalid.   CHOLHDL 6 02/16/2016   HTN- she is on low-dose ACE inhibitor (lisinopril 2.5 mg). She is also maintained on amlodipine 5 mg. BP Readings from Last 3 Encounters:  10/30/16 134/88  02/16/16 131/86  04/28/15 120/81   Hypothyroid- She is maintained on Synthroid 75 g once daily. Feels well on current dose of synthroid.  Lab Results  Component Value Date   TSH 1.33 02/16/2016      Review of Systems See HPI  Past Medical History:  Diagnosis Date  . Abnormal glucose   . Atypical squamous cell changes of undetermined significance (ASCUS) on cervical cytology with positive high risk human papilloma virus (HPV) 02/05/2013  . History of hemorrhoids   . Hyperlipidemia   . Hypertension   . Lipoma   . Obesity      Social History   Social History  . Marital status: Married    Spouse name: N/A  . Number of children: N/A  .  Years of education: N/A   Occupational History  . Not on file.   Social History Main Topics  . Smoking status: Never Smoker  . Smokeless tobacco: Never Used  . Alcohol use Not on file  . Drug use: Unknown  . Sexual activity: Not on file   Other Topics Concern  . Not on file   Social History Narrative   Occupation: Housewife      Married      Never Smoked      Alcohol use-no      Daughter - Alexandria Ayers (8y/o)    No past surgical history on file.  Family History  Problem Relation Age of Onset  . Hypertension Mother   . Kidney disease Mother        chronic renal insufficiency or some manner of kidney abnormality  . Hypothyroidism Mother        s/p thryroid surgery and is hypothyroid  . Osteoporosis Mother   . Coronary artery disease Unknown        sibling  . Other Unknown        denies FH of colon cancer,and diabetes  . Hypertension Father   . Hyperlipidemia Father     No Known Allergies  Current Outpatient Prescriptions on File Prior to Visit  Medication Sig Dispense Refill  . amLODipine (NORVASC) 5  MG tablet TAKE 1 TABLET BY MOUTH  DAILY 90 tablet 0  . atorvastatin (LIPITOR) 80 MG tablet TAKE 1 TABLET BY MOUTH  DAILY 90 tablet 1  . glucose blood (FREESTYLE LITE) test strip Use to check blood sugar once a day     . levothyroxine (SYNTHROID, LEVOTHROID) 75 MCG tablet TAKE 1 TABLET BY MOUTH  DAILY 90 tablet 0  . lisinopril (PRINIVIL,ZESTRIL) 2.5 MG tablet TAKE 1 TABLET BY MOUTH  DAILY 90 tablet 0  . Saxagliptin-Metformin (KOMBIGLYZE XR) 2.07-998 MG TB24 Take 1 tablet by mouth daily. 90 tablet 1  . glimepiride (AMARYL) 2 MG tablet TAKE 1 TABLET BY MOUTH  DAILY BEFORE BREAKFAST (Patient not taking: Reported on 10/30/2016) 90 tablet 0   No current facility-administered medications on file prior to visit.     BP 134/88 (BP Location: Left Arm, Cuff Size: Normal)   Pulse 69   Temp 98.3 F (36.8 C) (Oral)   Resp 16   Ht 5' 3.25" (1.607 m)   Wt 164 lb 3.2 oz (74.5 kg)    SpO2 99%   BMI 28.86 kg/m       Objective:   Physical Exam  Constitutional: She is oriented to person, place, and time. She appears well-developed and well-nourished.  Cardiovascular: Normal rate, regular rhythm and normal heart sounds.   No murmur heard. Pulmonary/Chest: Effort normal and breath sounds normal. No respiratory distress. She has no wheezes.  Musculoskeletal: She exhibits no edema.  Neurological: She is alert and oriented to person, place, and time.  Psychiatric: She has a normal mood and affect. Her behavior is normal. Judgment and thought content normal.          Assessment & Plan:  Thyroid nodule- due for follow up ultrasound- will order.   DM2- not checking sugars, last A1C above goal. refills provided for medications. Advised patient to let us know if she has any issues getting her medication the future so that we can ensure that she does not run out.  Hyperlipidemia-will obtain fasting lipid panel. Continue statin.  Hypertension-blood pressure stable on current medications continue same.  Hypothyroid-obtain follow-up TSH. Continue current dose of Synthroid.

## 2016-10-30 NOTE — Addendum Note (Signed)
Addended by: Caffie Pinto on: 10/30/2016 11:07 AM   Modules accepted: Orders

## 2016-10-31 ENCOUNTER — Telehealth: Payer: Self-pay | Admitting: Family

## 2016-10-31 DIAGNOSIS — IMO0001 Reserved for inherently not codable concepts without codable children: Secondary | ICD-10-CM

## 2016-10-31 DIAGNOSIS — E1165 Type 2 diabetes mellitus with hyperglycemia: Principal | ICD-10-CM

## 2016-10-31 NOTE — Telephone Encounter (Signed)
Please contact patient and let her know that her diabetes is very uncontrolled. I would like for her to meet with endocrinology for further treatment. Her triglycerides are improved. Please continue to work on diabetic diet and restart the Amaryl that she had not been taking.

## 2016-11-06 NOTE — Telephone Encounter (Signed)
Spoke with pt's daughter, Clarise Cruz and she voices understanding.

## 2017-01-31 ENCOUNTER — Encounter: Payer: Self-pay | Admitting: Family

## 2017-01-31 ENCOUNTER — Ambulatory Visit: Payer: 59 | Admitting: Family

## 2017-01-31 VITALS — BP 127/77 | HR 71 | Temp 98.4°F | Resp 16 | Ht 63.0 in | Wt 153.0 lb

## 2017-01-31 DIAGNOSIS — E039 Hypothyroidism, unspecified: Secondary | ICD-10-CM

## 2017-01-31 DIAGNOSIS — Z23 Encounter for immunization: Secondary | ICD-10-CM

## 2017-01-31 DIAGNOSIS — IMO0002 Reserved for concepts with insufficient information to code with codable children: Secondary | ICD-10-CM

## 2017-01-31 DIAGNOSIS — I10 Essential (primary) hypertension: Secondary | ICD-10-CM | POA: Diagnosis not present

## 2017-01-31 DIAGNOSIS — E1165 Type 2 diabetes mellitus with hyperglycemia: Secondary | ICD-10-CM

## 2017-01-31 DIAGNOSIS — E1129 Type 2 diabetes mellitus with other diabetic kidney complication: Secondary | ICD-10-CM

## 2017-01-31 DIAGNOSIS — E041 Nontoxic single thyroid nodule: Secondary | ICD-10-CM | POA: Diagnosis not present

## 2017-01-31 DIAGNOSIS — E785 Hyperlipidemia, unspecified: Secondary | ICD-10-CM | POA: Diagnosis not present

## 2017-01-31 NOTE — Patient Instructions (Signed)
Please complete lab work prior to leaving.   Keep up the good work with diet.

## 2017-01-31 NOTE — Assessment & Plan Note (Signed)
Clinically stable.  Continue same.

## 2017-01-31 NOTE — Assessment & Plan Note (Signed)
Stable on statin, continue same.

## 2017-01-31 NOTE — Assessment & Plan Note (Signed)
Clinically improved. Obtain follow up A1C.

## 2017-01-31 NOTE — Addendum Note (Signed)
Addended by: Jiles Prows on: 01/31/2017 03:16 PM   Modules accepted: Orders

## 2017-01-31 NOTE — Assessment & Plan Note (Signed)
Stable on current meds.  Continue same. 

## 2017-01-31 NOTE — Progress Notes (Signed)
Subjective:    Patient ID: Alexandria Ayers, female    DOB: [birth year], 51 y.o.   MRN: [id number]  HPI  Alexandria Ayers is a 51 yr old female who presents today for follow up.  1) DM2-last visit she was out of her sulfonylurea x 6 months. Rx was refilled.  Working on ketogenic diet.  Avoiding sugar and flour. Reports sugars generally around 100.  Denies hypoglycemia.   Lab Results  Component Value Date   HGBA1C 9.4 (H) 10/30/2016   HGBA1C 8.2 (H) 02/16/2016   HGBA1C 8.3 (H) 04/28/2015   Lab Results  Component Value Date   MICROALBUR 1.5 04/19/2014   LDLCALC 83 10/30/2016   CREATININE 0.48 10/30/2016   2) Hyperlipidemia- maintained on statin.  Lab Results  Component Value Date   CHOL 164 10/30/2016   HDL 42.40 10/30/2016   LDLCALC 83 10/30/2016   LDLDIRECT 122.0 02/16/2016   TRIG 194.0 (H) 10/30/2016   CHOLHDL 4 10/30/2016   3) hypertension-  BP Readings from Last 3 Encounters:  01/31/17 127/77  10/30/16 134/88  02/16/16 131/86   4) Hypothyroid- did not complete US thyroid. Will complete in January.   Lab Results  Component Value Date   TSH 1.96 10/30/2016       Review of Systems See HPI  Past Medical History:  Diagnosis Date  . Abnormal glucose   . Atypical squamous cell changes of undetermined significance (ASCUS) on cervical cytology with positive high risk human papilloma virus (HPV) 02/05/2013  . History of hemorrhoids   . Hyperlipidemia   . Hypertension   . Lipoma   . Obesity      Social History   Socioeconomic History  . Marital status: Married    Spouse name: Not on file  . Number of children: Not on file  . Years of education: Not on file  . Highest education level: Not on file  Social Needs  . Financial resource strain: Not on file  . Food insecurity - worry: Not on file  . Food insecurity - inability: Not on file  . Transportation needs - medical: Not on file  . Transportation needs - non-medical: Not on file  Occupational History  . Not  on file  Tobacco Use  . Smoking status: Never Smoker  . Smokeless tobacco: Never Used  Substance and Sexual Activity  . Alcohol use: Not on file  . Drug use: Not on file  . Sexual activity: Not on file  Other Topics Concern  . Not on file  Social History Narrative   Occupation: Housewife      Married      Never Smoked      Alcohol use-no      Daughter - Marlowe Kays (8y/o)    History reviewed. No pertinent surgical history.  Family History  Problem Relation Age of Onset  . Hypertension Mother   . Kidney disease Mother        chronic renal insufficiency or some manner of kidney abnormality  . Hypothyroidism Mother        s/p thryroid surgery and is hypothyroid  . Osteoporosis Mother   . Coronary artery disease Unknown        sibling  . Other Unknown        denies FH of colon cancer,and diabetes  . Hypertension Father   . Hyperlipidemia Father     No Known Allergies  Current Outpatient Medications on File Prior to Visit  Medication Sig Dispense Refill  . amLODipine (  NORVASC) 5 MG tablet Take 1 tablet (5 mg total) by mouth daily. 90 tablet 1  . atorvastatin (LIPITOR) 80 MG tablet Take 1 tablet (80 mg total) by mouth daily. 90 tablet 1  . glimepiride (AMARYL) 2 MG tablet TAKE 1 TABLET BY MOUTH  DAILY BEFORE BREAKFAST 90 tablet 1  . glucose blood (FREESTYLE LITE) test strip Use to check blood sugar once a day     . levothyroxine (SYNTHROID, LEVOTHROID) 75 MCG tablet Take 1 tablet (75 mcg total) by mouth daily. 90 tablet 1  . lisinopril (PRINIVIL,ZESTRIL) 2.5 MG tablet Take 1 tablet (2.5 mg total) by mouth daily. 90 tablet 1  . Saxagliptin-Metformin (KOMBIGLYZE XR) 2.07-998 MG TB24 Take 1 tablet by mouth daily. 90 tablet 1   No current facility-administered medications on file prior to visit.     BP 127/77 (BP Location: Left Arm, Patient Position: Sitting, Cuff Size: Small)   Pulse 71   Temp 98.4 F (36.9 C) (Oral)   Resp 16   Ht 5\' 3"  (1.6 m)   Wt 163 lb 6.4 oz (74.1 kg)    SpO2 100%   BMI 28.95 kg/m       Objective:   Physical Exam  Constitutional: She appears well-developed and well-nourished.  Cardiovascular: Normal rate, regular rhythm and normal heart sounds.  No murmur heard. Pulmonary/Chest: Effort normal and breath sounds normal. No respiratory distress. She has no wheezes.  Psychiatric: She has a normal mood and affect. Her behavior is normal. Judgment and thought content normal.          Assessment & Plan:

## 2017-02-01 ENCOUNTER — Encounter: Payer: Self-pay | Admitting: Family

## 2017-02-01 ENCOUNTER — Telehealth: Payer: Self-pay | Admitting: Family

## 2017-02-01 LAB — BASIC METABOLIC PANEL
BUN / CREAT RATIO: 26 (calc) — AB (ref 6–22)
BUN: 12 mg/dL (ref 7–25)
CALCIUM: 9.4 mg/dL (ref 8.6–10.4)
CHLORIDE: 103 mmol/L (ref 98–110)
CO2: 27 mmol/L (ref 20–32)
CREATININE: 0.46 mg/dL — AB (ref 0.50–1.05)
Glucose, Bld: 110 mg/dL — ABNORMAL HIGH (ref 65–99)
POTASSIUM: 4.4 mmol/L (ref 3.5–5.3)
Sodium: 137 mmol/L (ref 135–146)

## 2017-02-01 LAB — HEMOGLOBIN A1C
EAG (MMOL/L): 7.1 (calc)
Hgb A1c MFr Bld: 6.1 % of total Hgb — ABNORMAL HIGH (ref <5.7)
Mean Plasma Glucose: 128 (calc)

## 2017-02-01 MED ORDER — GLIMEPIRIDE 2 MG PO TABS: 1.0000 mg | ORAL_TABLET | Freq: Every day | ORAL | 1 refills | Status: DC

## 2017-02-01 NOTE — Telephone Encounter (Signed)
Sugar control looks great, much improved.  I would like her to cut amaryl tablet in half and take 1/2 tab once daily.

## 2017-02-03 NOTE — Telephone Encounter (Signed)
Left message for pt to return my call.

## 2017-02-03 NOTE — Telephone Encounter (Signed)
Pt's spouse returned call. Read message per provider verbatim, no questions.

## 2017-06-10 ENCOUNTER — Other Ambulatory Visit: Payer: Self-pay | Admitting: Family

## 2017-06-30 ENCOUNTER — Other Ambulatory Visit: Payer: Self-pay | Admitting: Family

## 2017-07-22 ENCOUNTER — Ambulatory Visit (HOSPITAL_BASED_OUTPATIENT_CLINIC_OR_DEPARTMENT_OTHER): Payer: 59

## 2017-07-22 ENCOUNTER — Telehealth: Payer: Self-pay | Admitting: Family

## 2017-07-22 ENCOUNTER — Encounter (HOSPITAL_BASED_OUTPATIENT_CLINIC_OR_DEPARTMENT_OTHER): Payer: Self-pay

## 2017-07-22 NOTE — Telephone Encounter (Signed)
-----   Message from Katha Hamming sent at 07/22/2017  9:33 AM EDT ----- Regarding: ultrasound We had Alexandria Ayers on schedule today for her thyroid ultrasound.   She cancelled her study when we discussed her financial responsibility with her and the interpreter.   She is going to call the [facility] and then, hopefully, call back to reschedule her ultrasound.  We had left several messages in the past to get her scheduled for this ultrasound.     Thanks, Hoyle Sauer

## 2017-08-01 ENCOUNTER — Ambulatory Visit: Payer: 59 | Admitting: Family

## 2017-08-01 ENCOUNTER — Other Ambulatory Visit: Payer: Self-pay | Admitting: Family

## 2017-08-01 ENCOUNTER — Encounter: Payer: Self-pay | Admitting: Family

## 2017-08-01 VITALS — BP 121/77 | HR 64 | Temp 98.5°F | Resp 16 | Ht 63.0 in | Wt 159.0 lb

## 2017-08-01 DIAGNOSIS — E785 Hyperlipidemia, unspecified: Secondary | ICD-10-CM

## 2017-08-01 DIAGNOSIS — M5412 Radiculopathy, cervical region: Secondary | ICD-10-CM

## 2017-08-01 DIAGNOSIS — E041 Nontoxic single thyroid nodule: Secondary | ICD-10-CM | POA: Diagnosis not present

## 2017-08-01 DIAGNOSIS — I10 Essential (primary) hypertension: Secondary | ICD-10-CM

## 2017-08-01 DIAGNOSIS — E119 Type 2 diabetes mellitus without complications: Secondary | ICD-10-CM | POA: Diagnosis not present

## 2017-08-01 LAB — BASIC METABOLIC PANEL
BUN: 14 mg/dL (ref 6–23)
CALCIUM: 9.1 mg/dL (ref 8.4–10.5)
CO2: 25 meq/L (ref 19–32)
Chloride: 105 mEq/L (ref 96–112)
Creatinine, Ser: 0.47 mg/dL (ref 0.40–1.20)
GFR: 146.91 mL/min (ref >60.00)
Glucose, Bld: 114 mg/dL — ABNORMAL HIGH (ref 70–99)
POTASSIUM: 4 meq/L (ref 3.5–5.1)
SODIUM: 137 meq/L (ref 135–145)

## 2017-08-01 LAB — LIPID PANEL
CHOL/HDL RATIO: 4
Cholesterol: 198 mg/dL (ref 0–200)
HDL: 48.9 mg/dL (ref >39.00)
LDL Cholesterol: 129 mg/dL — ABNORMAL HIGH (ref 0–99)
NonHDL: 149.14
TRIGLYCERIDES: 101 mg/dL (ref 0.0–149.0)
VLDL: 20.2 mg/dL (ref 0.0–40.0)

## 2017-08-01 LAB — HEMOGLOBIN A1C: HEMOGLOBIN A1C: 6.5 % (ref 4.6–6.5)

## 2017-08-01 MED ORDER — LEVOTHYROXINE SODIUM 75 MCG PO TABS: 75.0000 ug | ORAL_TABLET | Freq: Every day | ORAL | 1 refills | Status: AC

## 2017-08-01 MED ORDER — AMLODIPINE BESYLATE 5 MG PO TABS: 5.0000 mg | ORAL_TABLET | Freq: Every day | ORAL | 1 refills | Status: AC

## 2017-08-01 MED ORDER — SAXAGLIPTIN-METFORMIN ER 2.5-1000 MG PO TB24: 1.0000 | ORAL_TABLET | Freq: Every day | ORAL | 1 refills | Status: DC

## 2017-08-01 MED ORDER — GLIMEPIRIDE 1 MG PO TABS: 1.0000 mg | ORAL_TABLET | Freq: Every day | ORAL | 1 refills | Status: DC

## 2017-08-01 MED ORDER — MELOXICAM 7.5 MG PO TABS: 7.5000 mg | ORAL_TABLET | Freq: Every day | ORAL | 0 refills | Status: DC

## 2017-08-01 MED ORDER — ATORVASTATIN CALCIUM 80 MG PO TABS: 80.0000 mg | ORAL_TABLET | Freq: Every day | ORAL | 1 refills | Status: AC

## 2017-08-01 MED ORDER — LISINOPRIL 2.5 MG PO TABS: 2.5000 mg | ORAL_TABLET | Freq: Every day | ORAL | 1 refills | Status: AC

## 2017-08-01 NOTE — Progress Notes (Signed)
Subjective:    Patient ID: Alexandria Ayers, female    DOB: January 08, 1966, 52 y.o.   MRN: [id number]  HPI  [name] is a 52 yr old female who presents today for follow up. A [place] Interpretor was present for this visit.   DM2- maintained on amaryl, kombiglyze xr. (she states that she has not taken kombiglyze x 2 months because she ran out of refills.   Lab Results  Component Value Date   HGBA1C 6.1 (H) 01/31/2017   HGBA1C 9.4 (H) 10/30/2016   HGBA1C 8.2 (H) 02/16/2016   Lab Results  Component Value Date   MICROALBUR 1.5 04/19/2014   LDLCALC 83 10/30/2016   CREATININE 0.46 (L) 01/31/2017   HTN- maintained on amlodipine 5mg , lisinopril 2.5mg .  She denies LE edema.    BP Readings from Last 3 Encounters:  08/01/17 121/77  01/31/17 127/77  10/30/16 134/88   Hypothyroid- maintained on synthroid. She reports that she feels well on current dose of synthroid.   Lab Results  Component Value Date   TSH 1.96 10/30/2016   Hyperlipidemia- maintained on atorvastatin.   She reports + neck pain, pain radiating down the right arm with intermittent tingling of her fingers.   Review of Systems See HPI  Past Medical History:  Diagnosis Date  . Abnormal glucose   . Atypical squamous cell changes of undetermined significance (ASCUS) on cervical cytology with positive high risk human papilloma virus (HPV) 02/05/2013  . History of hemorrhoids   . Hyperlipidemia   . Hypertension   . Lipoma   . Obesity      Social History   Socioeconomic History  . Marital status: Married    Spouse name: Not on file  . Number of children: Not on file  . Years of education: Not on file  . Highest education level: Not on file  Occupational History  . Not on file  Social Needs  . Financial resource strain: Not on file  . Food insecurity:    Worry: Not on file    Inability: Not on file  . Transportation needs:    Medical: Not on file    Non-medical: Not on file  Tobacco Use  . Smoking status:  Never Smoker  . Smokeless tobacco: Never Used  Substance and Sexual Activity  . Alcohol use: Not on file  . Drug use: Not on file  . Sexual activity: Not on file  Lifestyle  . Physical activity:    Days per week: Not on file    Minutes per session: Not on file  . Stress: Not on file  Relationships  . Social connections:    Talks on phone: Not on file    Gets together: Not on file    Attends religious service: Not on file    Active member of club or organization: Not on file    Attends meetings of clubs or organizations: Not on file    Relationship status: Not on file  . Intimate partner violence:    Fear of current or ex partner: Not on file    Emotionally abused: Not on file    Physically abused: Not on file    Forced sexual activity: Not on file  Other Topics Concern  . Not on file  Social History Narrative   Occupation: Housewife      Married      Never Smoked      Alcohol use-no      Daughter - Alexandria Ayers (8y/o)  No past surgical history on file.  Family History  Problem Relation Age of Onset  . Hypertension Mother   . Kidney disease Mother        chronic renal insufficiency or some manner of kidney abnormality  . Hypothyroidism Mother        s/p thryroid surgery and is hypothyroid  . Osteoporosis Mother   . Coronary artery disease Unknown        sibling  . Other Unknown        denies FH of colon cancer,and diabetes  . Hypertension Father   . Hyperlipidemia Father     No Known Allergies  Current Outpatient Medications on File Prior to Visit  Medication Sig Dispense Refill  . amLODipine (NORVASC) 5 MG tablet TAKE 1 TABLET BY MOUTH  DAILY 90 tablet 0  . atorvastatin (LIPITOR) 80 MG tablet TAKE 1 TABLET BY MOUTH  DAILY 90 tablet 0  . glimepiride (AMARYL) 2 MG tablet TAKE 1 TABLET BY MOUTH  DAILY BEFORE BREAKFAST 90 tablet 0  . glucose blood (FREESTYLE LITE) test strip Use to check blood sugar once a day     . levothyroxine (SYNTHROID, LEVOTHROID) 75 MCG  tablet TAKE 1 TABLET BY MOUTH  DAILY 90 tablet 0  . lisinopril (PRINIVIL,ZESTRIL) 2.5 MG tablet TAKE 1 TABLET BY MOUTH  DAILY 90 tablet 0  . Saxagliptin-Metformin (KOMBIGLYZE XR) 2.07-998 MG TB24 Take 1 tablet by mouth daily. 90 tablet 1   No current facility-administered medications on file prior to visit.     BP 121/77 (BP Location: Left Arm, Patient Position: Sitting, Cuff Size: Small)   Pulse 64   Temp 98.5 F (36.9 C) (Oral)   Resp 16   Ht 5\' 3"  (1.6 m)   Wt 159 lb (72.1 kg)   LMP 06/12/2017   SpO2 100%   BMI 28.17 kg/m       Objective:   Physical Exam  Constitutional: She is oriented to person, place, and time. She appears well-developed and well-nourished.  Cardiovascular: Normal rate, regular rhythm and normal heart sounds.  No murmur heard. Pulmonary/Chest: Effort normal and breath sounds normal. No respiratory distress. She has no wheezes.  Musculoskeletal: She exhibits no edema.  Neurological: She is alert and oriented to person, place, and time.  Bilateral hand grasps are 5/5, bilateral UE strength is normal.   Psychiatric: She has a normal mood and affect. Her behavior is normal. Judgment and thought content normal.          Assessment & Plan:  DM2- will plan to restart Kombiglyze and continue amaryl. Obtain follow up A1C.  HTN- BP is stable on current meds. Continue same, obtain follow up bmet.  Hyperlipidemia- tolerating statin, continue same, obtain follow up lipid panel.  Hypothyroid- clinically stable on synthroid, continue same, obtain follow up TSH.    Cervical radiculopathy- rx with meloxicam. If not improved in 1 month or if symptoms worsen could consider further imaging.

## 2017-08-01 NOTE — Patient Instructions (Addendum)
Please complete lab work prior to leaving. You should be contacted about scheduling your thyroid ultrasound. Begin meloxicam for neck/arm pain. Let me know if symptoms worsen or if not improved in 1 moth.

## 2017-08-14 IMAGING — MG MM DIGITAL SCREENING BILAT W/ CAD
6 series · 6 of 6 positions shown · non-contrast
Comparison: Previous exam(s).

CLINICAL DATA: Screening.

EXAM:
DIGITAL SCREENING BILATERAL MAMMOGRAM WITH CAD

[L MLO]
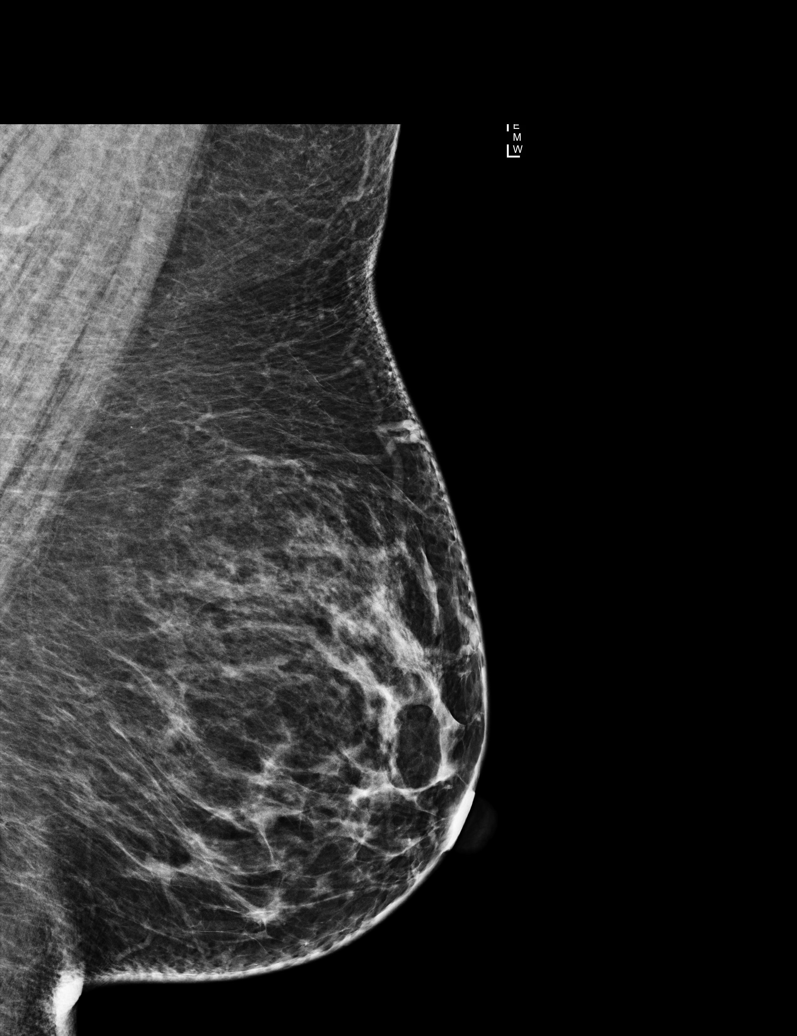

[L CC (1 of 2)]
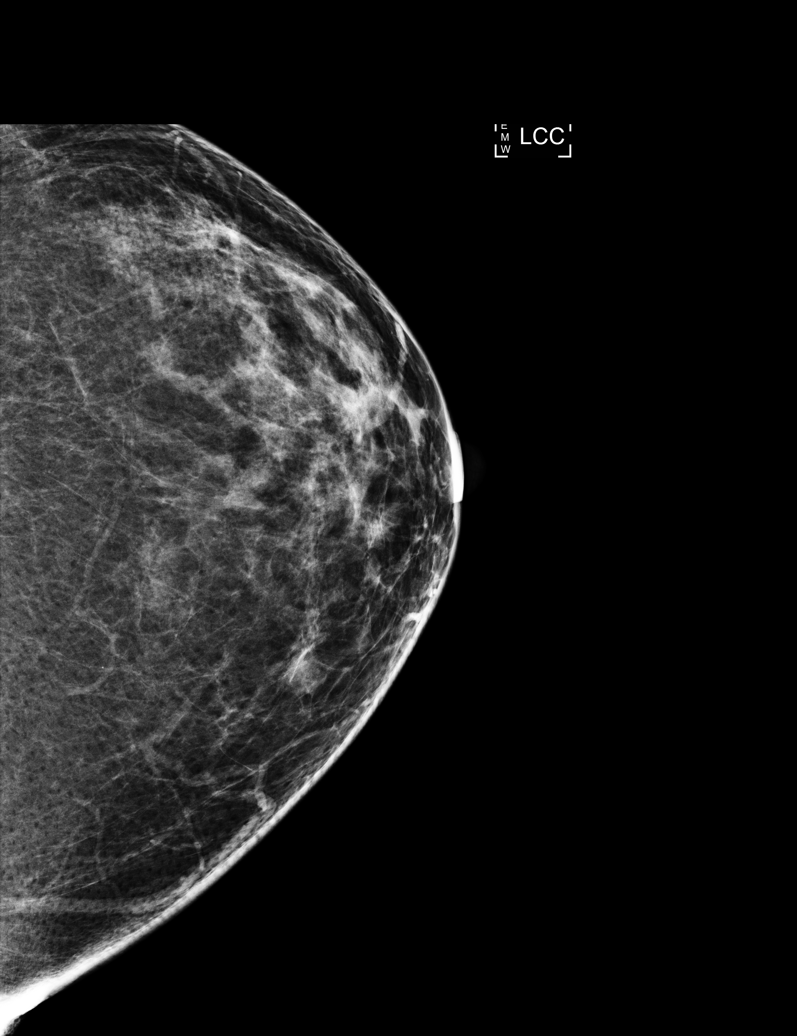

[R CC]
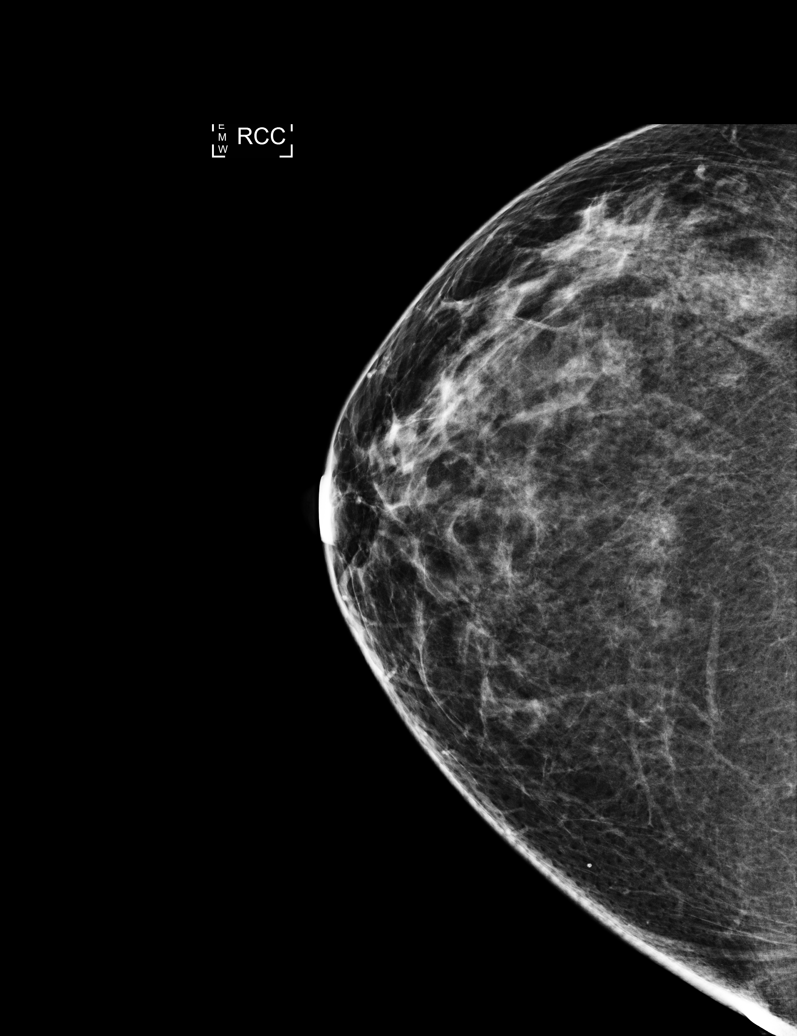

[R MLO (1 of 2)]
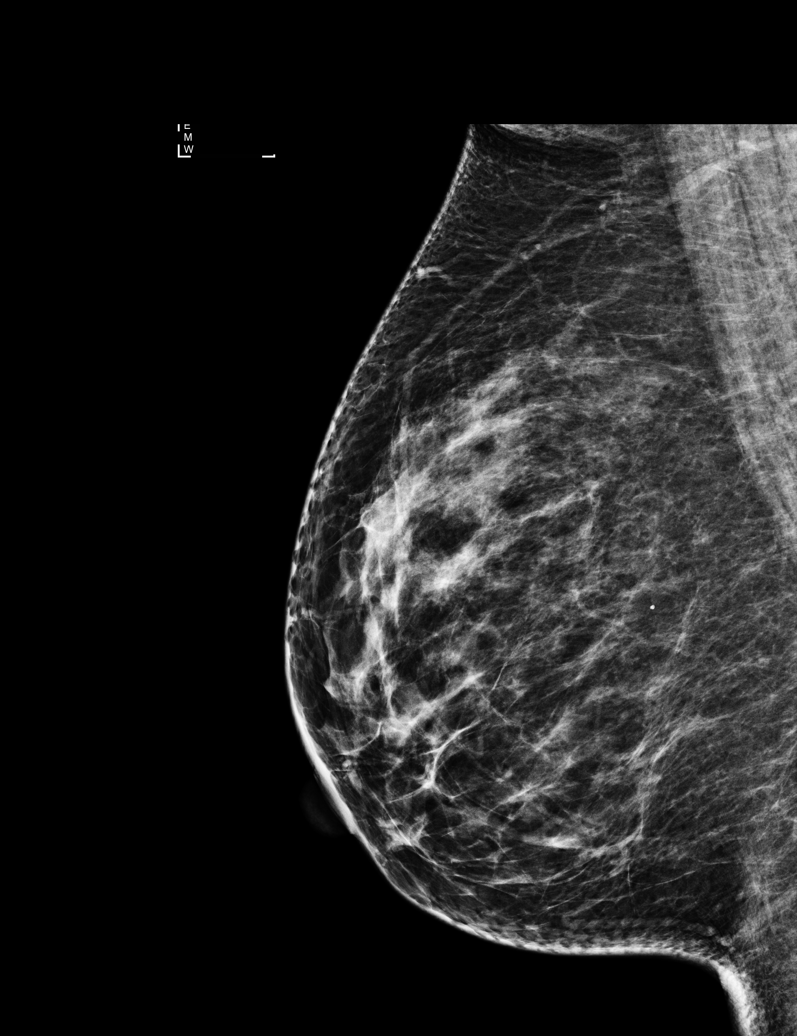

[R MLO (2 of 2)]
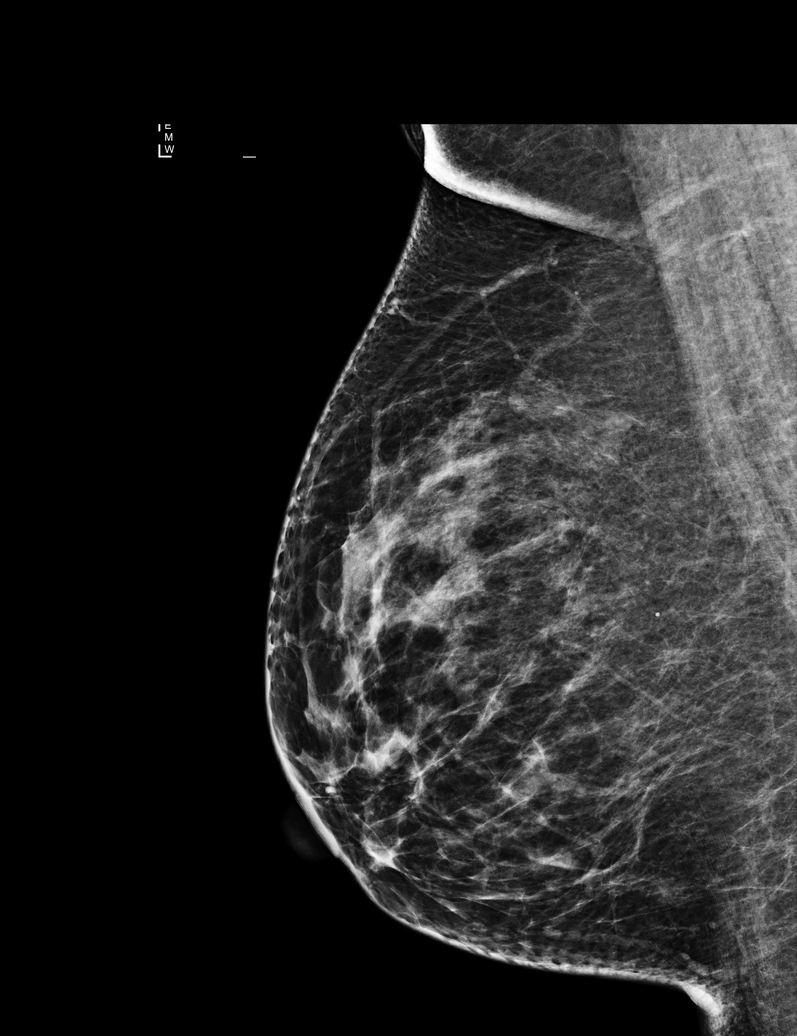

[L CC (2 of 2)]
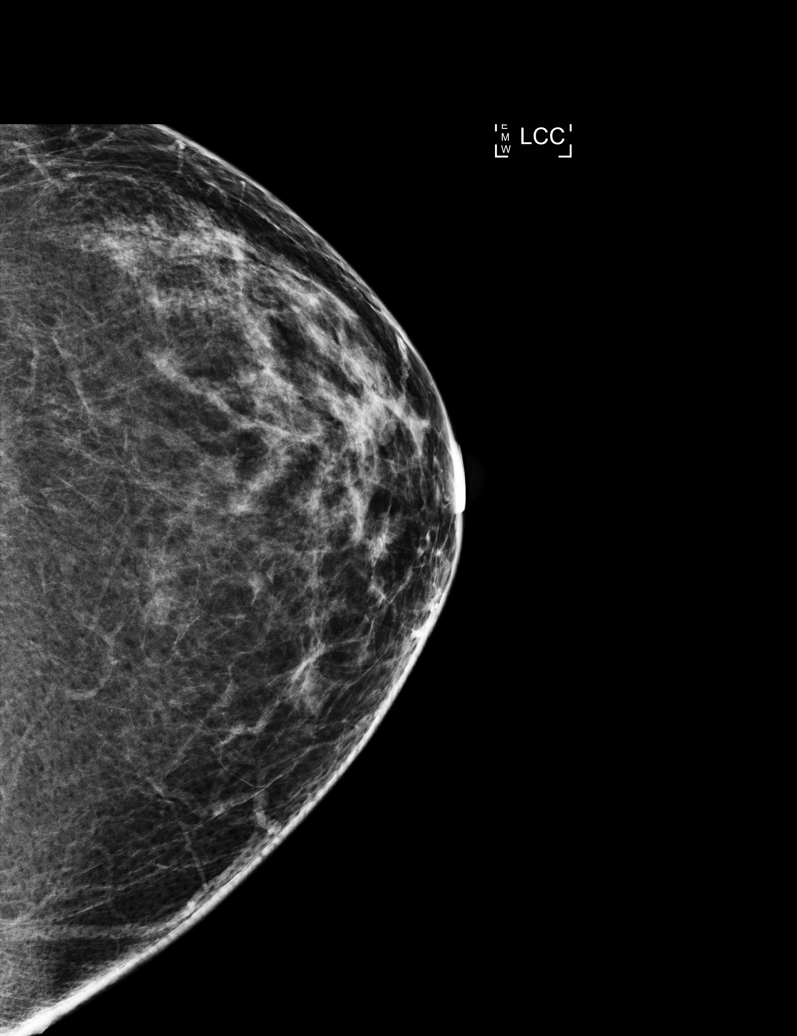

[6 of 6 positions shown; findings below may reference images not displayed]

ACR Breast Density Category b: There are scattered areas of
fibroglandular density.
FINDINGS: There are no findings suspicious for malignancy. Images were
processed with CAD.
IMPRESSION: No mammographic evidence of malignancy. A result letter of this
screening mammogram will be mailed directly to the patient.

RECOMMENDATION:
Screening mammogram in one year. (Code:AS-G-LCT)

BI-RADS CATEGORY  1: Negative.

## 2017-08-31 ENCOUNTER — Other Ambulatory Visit: Payer: Self-pay | Admitting: Family

## 2017-08-31 DIAGNOSIS — M5412 Radiculopathy, cervical region: Secondary | ICD-10-CM

## 2017-09-11 ENCOUNTER — Ambulatory Visit (INDEPENDENT_AMBULATORY_CARE_PROVIDER_SITE_OTHER): Payer: 59 | Admitting: Family

## 2017-09-11 ENCOUNTER — Encounter: Payer: Self-pay | Admitting: Family

## 2017-09-11 VITALS — BP 117/80 | HR 78 | Temp 98.3°F | Resp 16 | Ht 63.0 in | Wt 153.2 lb

## 2017-09-11 DIAGNOSIS — Z0000 Encounter for general adult medical examination without abnormal findings: Secondary | ICD-10-CM

## 2017-09-11 LAB — CBC WITH DIFFERENTIAL/PLATELET
BASOS PCT: 1.3 % (ref 0.0–3.0)
Basophils Absolute: 0.1 10*3/uL (ref 0.0–0.1)
EOS PCT: 1.9 % (ref 0.0–5.0)
Eosinophils Absolute: 0.1 10*3/uL (ref 0.0–0.7)
HCT: 39 % (ref 36.0–46.0)
Hemoglobin: 13.4 g/dL (ref 12.0–15.0)
Lymphocytes Relative: 35.6 % (ref 12.0–46.0)
Lymphs Abs: 2.1 10*3/uL (ref 0.7–4.0)
MCHC: 34.3 g/dL (ref 30.0–36.0)
MCV: 89.6 fl (ref 78.0–100.0)
MONO ABS: 0.4 10*3/uL (ref 0.1–1.0)
Monocytes Relative: 6.6 % (ref 3.0–12.0)
NEUTROS ABS: 3.2 10*3/uL (ref 1.4–7.7)
NEUTROS PCT: 54.6 % (ref 43.0–77.0)
PLATELETS: 305 10*3/uL (ref 150.0–400.0)
RBC: 4.35 Mil/uL (ref 3.87–5.11)
RDW: 12.5 % (ref 11.5–15.5)
WBC: 5.9 10*3/uL (ref 4.0–10.5)

## 2017-09-11 LAB — URINALYSIS, ROUTINE W REFLEX MICROSCOPIC
Bilirubin Urine: NEGATIVE
HGB URINE DIPSTICK: NEGATIVE
Ketones, ur: NEGATIVE
Leukocytes, UA: NEGATIVE
Nitrite: NEGATIVE
RBC / HPF: NONE SEEN (ref 0–?)
Specific Gravity, Urine: 1.025 (ref 1.000–1.030)
TOTAL PROTEIN, URINE-UPE24: NEGATIVE
URINE GLUCOSE: NEGATIVE
UROBILINOGEN UA: 0.2 (ref 0.0–1.0)
WBC UA: NONE SEEN (ref 0–?)
pH: 6 (ref 5.0–8.0)

## 2017-09-11 LAB — HEPATIC FUNCTION PANEL
ALT: 16 U/L (ref 0–35)
AST: 11 U/L (ref 0–37)
Albumin: 4.6 g/dL (ref 3.5–5.2)
Alkaline Phosphatase: 43 U/L (ref 39–117)
BILIRUBIN TOTAL: 0.5 mg/dL (ref 0.2–1.2)
Bilirubin, Direct: 0.1 mg/dL (ref 0.0–0.3)
Total Protein: 7.5 g/dL (ref 6.0–8.3)

## 2017-09-11 LAB — TSH: TSH: 0.92 u[IU]/mL (ref 0.35–4.50)

## 2017-09-11 NOTE — Progress Notes (Deleted)
.  ed

## 2017-09-11 NOTE — Patient Instructions (Signed)
Please complete lab work prior to leaving. Continue to work on healthy diet exercise and weight loss.

## 2017-09-11 NOTE — Progress Notes (Signed)
Subjective:    Patient ID: Alexandria Ayers, female    DOB: 1965-09-14, 52 y.o.   MRN: [id number]  HPI Patient is a 52 year old female who presents today for her annual physical.  Patient presents today for complete physical.  Immunizations: Tetanus 2013, Pneumovax up-to-date Diet: healthy Exercise:  walking Colonoscopy: Completed 09/15/2009, due 09/16/2019 Pap Smear: Performed 02/16/2016 Mammogram: Due, last mammogram was performed 05/02/2015     Review of Systems  Constitutional: Negative for unexpected weight change.  HENT: Negative for rhinorrhea.   Respiratory: Negative for cough and shortness of breath.   Cardiovascular: Negative for chest pain.  Gastrointestinal: Negative for blood in stool, constipation and diarrhea.  Genitourinary: Negative for dysuria, frequency and hematuria.  Musculoskeletal:       Some improvement in her right shoulder pain but not resolved  Neurological: Negative for headaches.  Hematological: Negative for adenopathy.  Psychiatric/Behavioral:       Denies depression/anxiety   Past Medical History:  Diagnosis Date  . Abnormal glucose   . Atypical squamous cell changes of undetermined significance (ASCUS) on cervical cytology with positive high risk human papilloma virus (HPV) 02/05/2013  . History of hemorrhoids   . Hyperlipidemia   . Hypertension   . Lipoma   . Obesity      Social History   Socioeconomic History  . Marital status: Married    Spouse name: Not on file  . Number of children: Not on file  . Years of education: Not on file  . Highest education level: Not on file  Occupational History  . Not on file  Social Needs  . Financial resource strain: Not on file  . Food insecurity:    Worry: Not on file    Inability: Not on file  . Transportation needs:    Medical: Not on file    Non-medical: Not on file  Tobacco Use  . Smoking status: Never Smoker  . Smokeless tobacco: Never Used  Substance and Sexual Activity  . Alcohol  use: Not on file  . Drug use: Not on file  . Sexual activity: Not on file  Lifestyle  . Physical activity:    Days per week: Not on file    Minutes per session: Not on file  . Stress: Not on file  Relationships  . Social connections:    Talks on phone: Not on file    Gets together: Not on file    Attends religious service: Not on file    Active member of club or organization: Not on file    Attends meetings of clubs or organizations: Not on file    Relationship status: Not on file  . Intimate partner violence:    Fear of current or ex partner: Not on file    Emotionally abused: Not on file    Physically abused: Not on file    Forced sexual activity: Not on file  Other Topics Concern  . Not on file  Social History Narrative   Occupation: Housewife      Married      Never Smoked      Alcohol use-no      Daughter - Marlowe Kays (8y/o)    No past surgical history on file.  Family History  Problem Relation Age of Onset  . Hypertension Mother   . Kidney disease Mother        chronic renal insufficiency or some manner of kidney abnormality  . Hypothyroidism Mother  s/p thryroid surgery and is hypothyroid  . Osteoporosis Mother   . Coronary artery disease Unknown        sibling  . Other Unknown        denies FH of colon cancer,and diabetes  . Hypertension Father   . Hyperlipidemia Father     No Known Allergies  Current Outpatient Medications on File Prior to Visit  Medication Sig Dispense Refill  . amLODipine (NORVASC) 5 MG tablet Take 1 tablet (5 mg total) by mouth daily. 90 tablet 1  . atorvastatin (LIPITOR) 80 MG tablet Take 1 tablet (80 mg total) by mouth daily. 90 tablet 1  . glimepiride (AMARYL) 1 MG tablet Take 1 tablet (1 mg total) by mouth daily with breakfast. 90 tablet 1  . glucose blood (FREESTYLE LITE) test strip Use to check blood sugar once a day     . levothyroxine (SYNTHROID, LEVOTHROID) 75 MCG tablet Take 1 tablet (75 mcg total) by mouth daily. 90  tablet 1  . lisinopril (PRINIVIL,ZESTRIL) 2.5 MG tablet Take 1 tablet (2.5 mg total) by mouth daily. 90 tablet 1  . meloxicam (MOBIC) 7.5 MG tablet TAKE 1 TABLET BY MOUTH EVERY DAY 30 tablet 0   No current facility-administered medications on file prior to visit.     BP 117/80 (BP Location: Left Arm, Patient Position: Sitting, Cuff Size: Small)   Pulse 78   Temp 98.3 F (36.8 C) (Oral)   Resp 16   Ht 5\' 3"  (1.6 m)   Wt 153 lb 3.2 oz (69.5 kg)   SpO2 98%   BMI 27.14 kg/m       Objective:   Physical Exam  Physical Exam  Constitutional: She is oriented to person, place, and time. She appears well-developed and well-nourished. No distress.  HENT:  Head: Normocephalic and atraumatic.  Right Ear: Tympanic membrane and ear canal normal.  Left Ear: Tympanic membrane and ear canal normal.  Mouth/Throat: Oropharynx is clear and moist.  Eyes: Pupils are equal, round, and reactive to light. No scleral icterus.  Neck: Normal range of motion. No thyromegaly present.  Cardiovascular: Normal rate and regular rhythm.   No murmur heard. Pulmonary/Chest: Effort normal and breath sounds normal. No respiratory distress. He has no wheezes. She has no rales. She exhibits no tenderness.  Abdominal: Soft. Bowel sounds are normal. She exhibits no distension and no mass. There is no tenderness. There is no rebound and no guarding.  Musculoskeletal: She exhibits no edema.  Lymphadenopathy:    She has no cervical adenopathy.  Neurological: She is alert and oriented to person, place, and time. She has normal patellar reflexes. She exhibits normal muscle tone. Coordination normal.  Skin: Skin is warm and dry.  Psychiatric: She has a normal mood and affect. Her behavior is normal. Judgment and thought content normal.  Breasts: Examined lying Right: Without masses, retractions, discharge or axillary adenopathy.  Left: Without masses, retractions, discharge or axillary adenopathy.  Pelvic:  deferred        Assessment & Plan:        Assessment & Plan:  Preventative care-encourage patient to continue healthy diet, exercise, and weight loss.  Tetanus and Pneumovax are up-to-date.  She is a candidate for the shingles vaccine however this is on national back order and unavailable.  Will obtain routine lab work.  Pap is up-to-date, will refer for mammogram.  EKG tracing is personally reviewed.  EKG notes NSR.  No acute changes.

## 2017-09-13 ENCOUNTER — Encounter: Payer: Self-pay | Admitting: Family

## 2017-12-19 ENCOUNTER — Other Ambulatory Visit: Payer: Self-pay | Admitting: Family

## 2017-12-19 DIAGNOSIS — E1165 Type 2 diabetes mellitus with hyperglycemia: Secondary | ICD-10-CM

## 2017-12-22 NOTE — Telephone Encounter (Signed)
Glimiperide rx request denied, as pt. Has existing refill on rx written by PCP on 08/01/17.

## 2018-01-04 ENCOUNTER — Other Ambulatory Visit: Payer: Self-pay | Admitting: Family

## 2018-04-07 ENCOUNTER — Telehealth: Payer: Self-pay | Admitting: Family

## 2018-04-07 DIAGNOSIS — E041 Nontoxic single thyroid nodule: Secondary | ICD-10-CM

## 2018-04-07 NOTE — Telephone Encounter (Signed)
Lm for patient to call back about this information

## 2018-04-07 NOTE — Telephone Encounter (Signed)
noted 

## 2018-04-07 NOTE — Telephone Encounter (Signed)
Please contact pt and let her know that I reviewed her chart and she is overdue to complete an ultrasound to re-check her thyroid nodule. I have placed the order.

## 2018-04-08 NOTE — Telephone Encounter (Signed)
Lm again today 

## 2018-04-14 NOTE — Telephone Encounter (Signed)
Can you please mail her a letter with below information? thanks

## 2018-04-15 NOTE — Telephone Encounter (Signed)
Letter mailed out to patient with this information

## 2018-07-08 ENCOUNTER — Telehealth: Payer: Self-pay | Admitting: Family

## 2018-07-08 NOTE — Telephone Encounter (Signed)
I explained to Alexandria Ayers him and his wife are due for follow ups. He does not want to schedule virtual appointment. I explained to him this can interfere with [place] being able to refill their prescriptions. He will like to have labs only, I explained to him in order to have labs they needed a virtual visit.  He said he will call us back.

## 2018-07-08 NOTE — Telephone Encounter (Signed)
Please contact pt's husband Naval architect) and let him know that both he and his wife are due for OV.  I believe he speaks better english than she does.

## 2018-07-09 NOTE — Telephone Encounter (Signed)
Noted  

## 2020-11-25 ENCOUNTER — Other Ambulatory Visit: Payer: Self-pay

## 2020-11-25 ENCOUNTER — Encounter (HOSPITAL_BASED_OUTPATIENT_CLINIC_OR_DEPARTMENT_OTHER): Payer: Self-pay

## 2020-11-25 ENCOUNTER — Emergency Department (HOSPITAL_BASED_OUTPATIENT_CLINIC_OR_DEPARTMENT_OTHER): Payer: BC Managed Care – PPO | Admitting: Radiology

## 2020-11-25 ENCOUNTER — Emergency Department (HOSPITAL_BASED_OUTPATIENT_CLINIC_OR_DEPARTMENT_OTHER)
Admission: EM | Admit: 2020-11-25 | Discharge: 2020-11-25 | Disposition: A | Discharge status: Home / Self Care | Payer: BC Managed Care – PPO | Attending: Emergency Medicine | Admitting: Emergency Medicine

## 2020-11-25 DIAGNOSIS — M542 Cervicalgia: Secondary | ICD-10-CM | POA: Diagnosis not present

## 2020-11-25 DIAGNOSIS — Y92410 Unspecified street and highway as the place of occurrence of the external cause: Secondary | ICD-10-CM | POA: Diagnosis not present

## 2020-11-25 DIAGNOSIS — M25511 Pain in right shoulder: Secondary | ICD-10-CM | POA: Diagnosis not present

## 2020-11-25 DIAGNOSIS — E119 Type 2 diabetes mellitus without complications: Secondary | ICD-10-CM | POA: Diagnosis not present

## 2020-11-25 DIAGNOSIS — I10 Essential (primary) hypertension: Secondary | ICD-10-CM | POA: Insufficient documentation

## 2020-11-25 DIAGNOSIS — Z7984 Long term (current) use of oral hypoglycemic drugs: Secondary | ICD-10-CM | POA: Insufficient documentation

## 2020-11-25 DIAGNOSIS — E039 Hypothyroidism, unspecified: Secondary | ICD-10-CM | POA: Insufficient documentation

## 2020-11-25 DIAGNOSIS — Z79899 Other long term (current) drug therapy: Secondary | ICD-10-CM | POA: Diagnosis not present

## 2020-11-25 HISTORY — DX: Disorder of thyroid, unspecified: E07.9

## 2020-11-25 MED ORDER — METHOCARBAMOL 500 MG PO TABS: 500.0000 mg | ORAL_TABLET | Freq: Two times a day (BID) | ORAL | 0 refills | Status: AC

## 2020-11-25 MED ORDER — NAPROXEN 500 MG PO TABS: 500.0000 mg | ORAL_TABLET | Freq: Two times a day (BID) | ORAL | 0 refills | Status: AC
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
